# Patient Record
Sex: Female | Born: 1950 | Race: White | Hispanic: No | Marital: Single | Smoking: Never smoker
Health system: Southern US, Community
[De-identification: ages and names within clinical notes are randomized; demographics above are authoritative.]

## PROBLEM LIST (undated history)

## (undated) DIAGNOSIS — J189 Pneumonia, unspecified organism: Secondary | ICD-10-CM

## (undated) DIAGNOSIS — G4700 Insomnia, unspecified: Secondary | ICD-10-CM

## (undated) DIAGNOSIS — R002 Palpitations: Secondary | ICD-10-CM

## (undated) DIAGNOSIS — I10 Essential (primary) hypertension: Secondary | ICD-10-CM

## (undated) DIAGNOSIS — M1990 Unspecified osteoarthritis, unspecified site: Secondary | ICD-10-CM

## (undated) DIAGNOSIS — J45909 Unspecified asthma, uncomplicated: Secondary | ICD-10-CM

## (undated) HISTORY — PX: TONSILLECTOMY: SUR1361

## (undated) HISTORY — PX: BUNIONECTOMY: SHX129

## (undated) HISTORY — PX: KNEE ARTHROPLASTY: SHX992

---

## 2006-11-28 ENCOUNTER — Inpatient Hospital Stay (HOSPITAL_COMMUNITY): Admission: RE | Admit: 2006-11-28 | Discharge: 2006-11-30 | Payer: Self-pay | Admitting: Orthopedic Surgery

## 2008-08-02 IMAGING — CR DG CHEST 2V
2 series · 2 of 2 positions shown · non-contrast
Comparison: None.

Exam: Chest, 2 views.

HISTORY: Preoperative radiograph.

[view not recorded (1 of 2)]
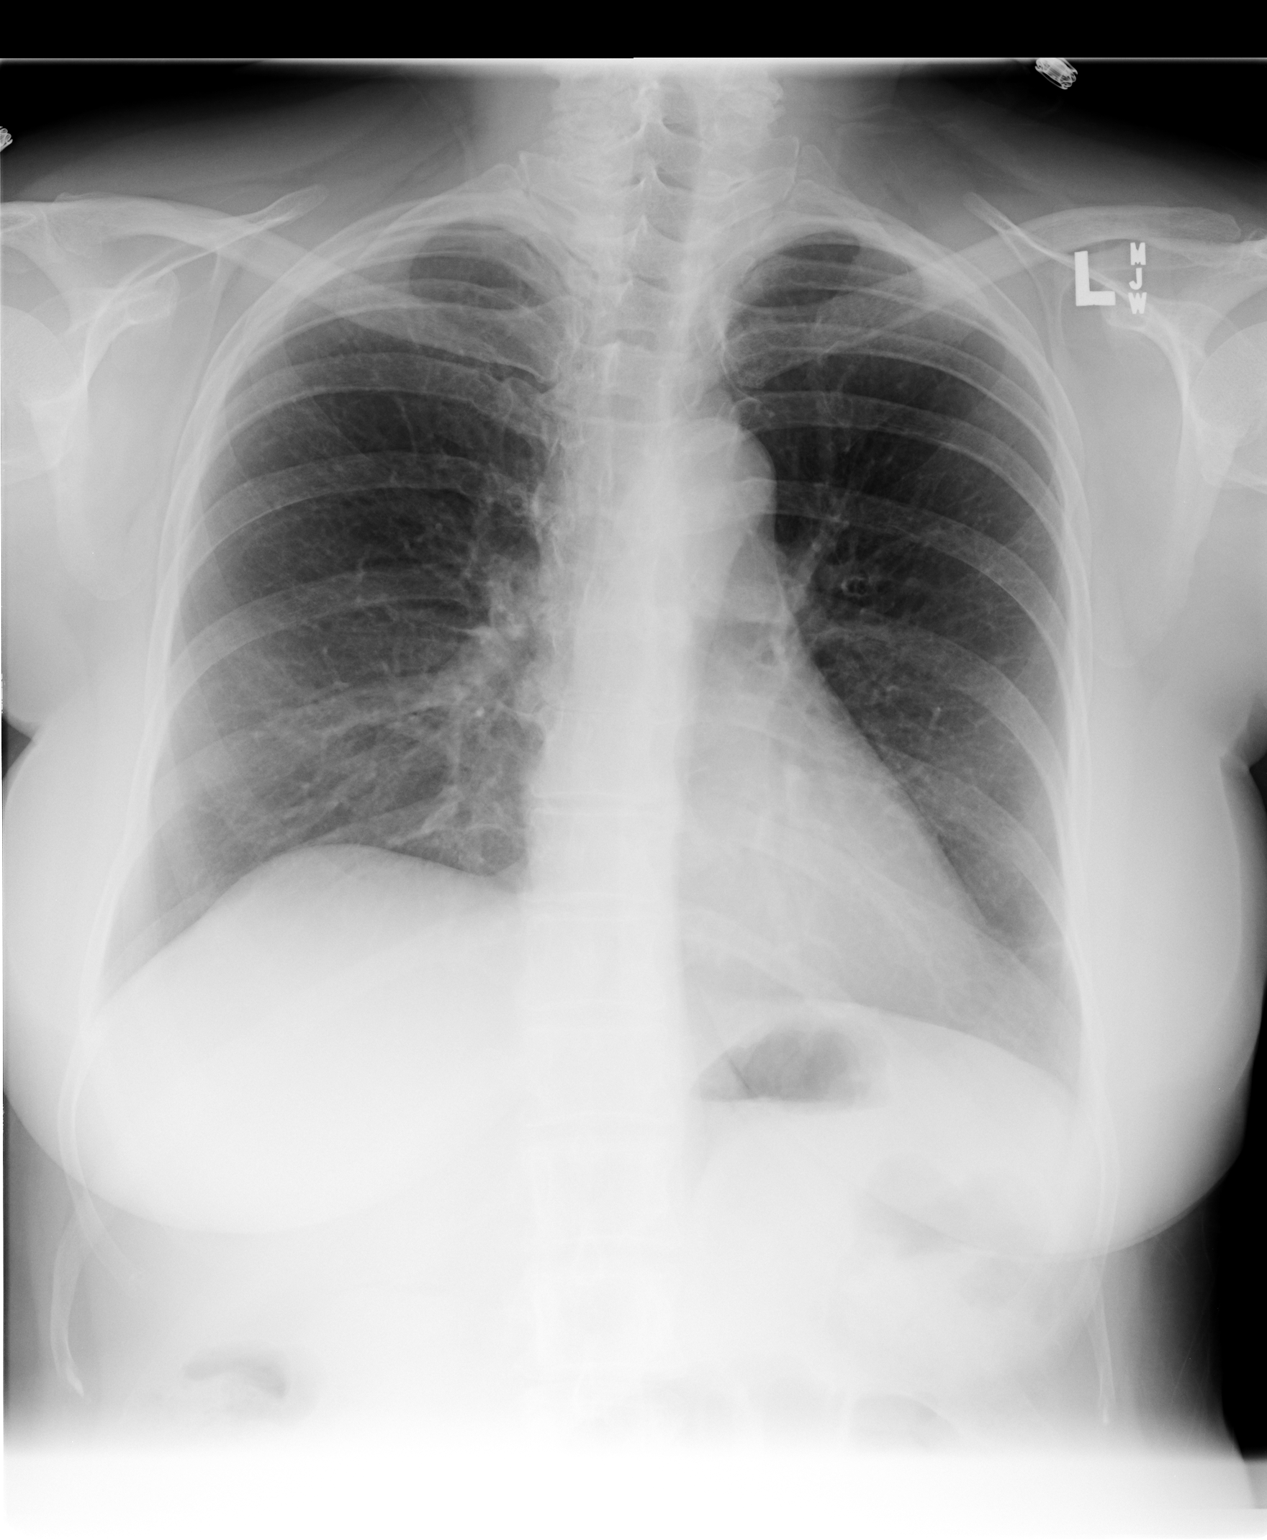

[view not recorded (2 of 2)]
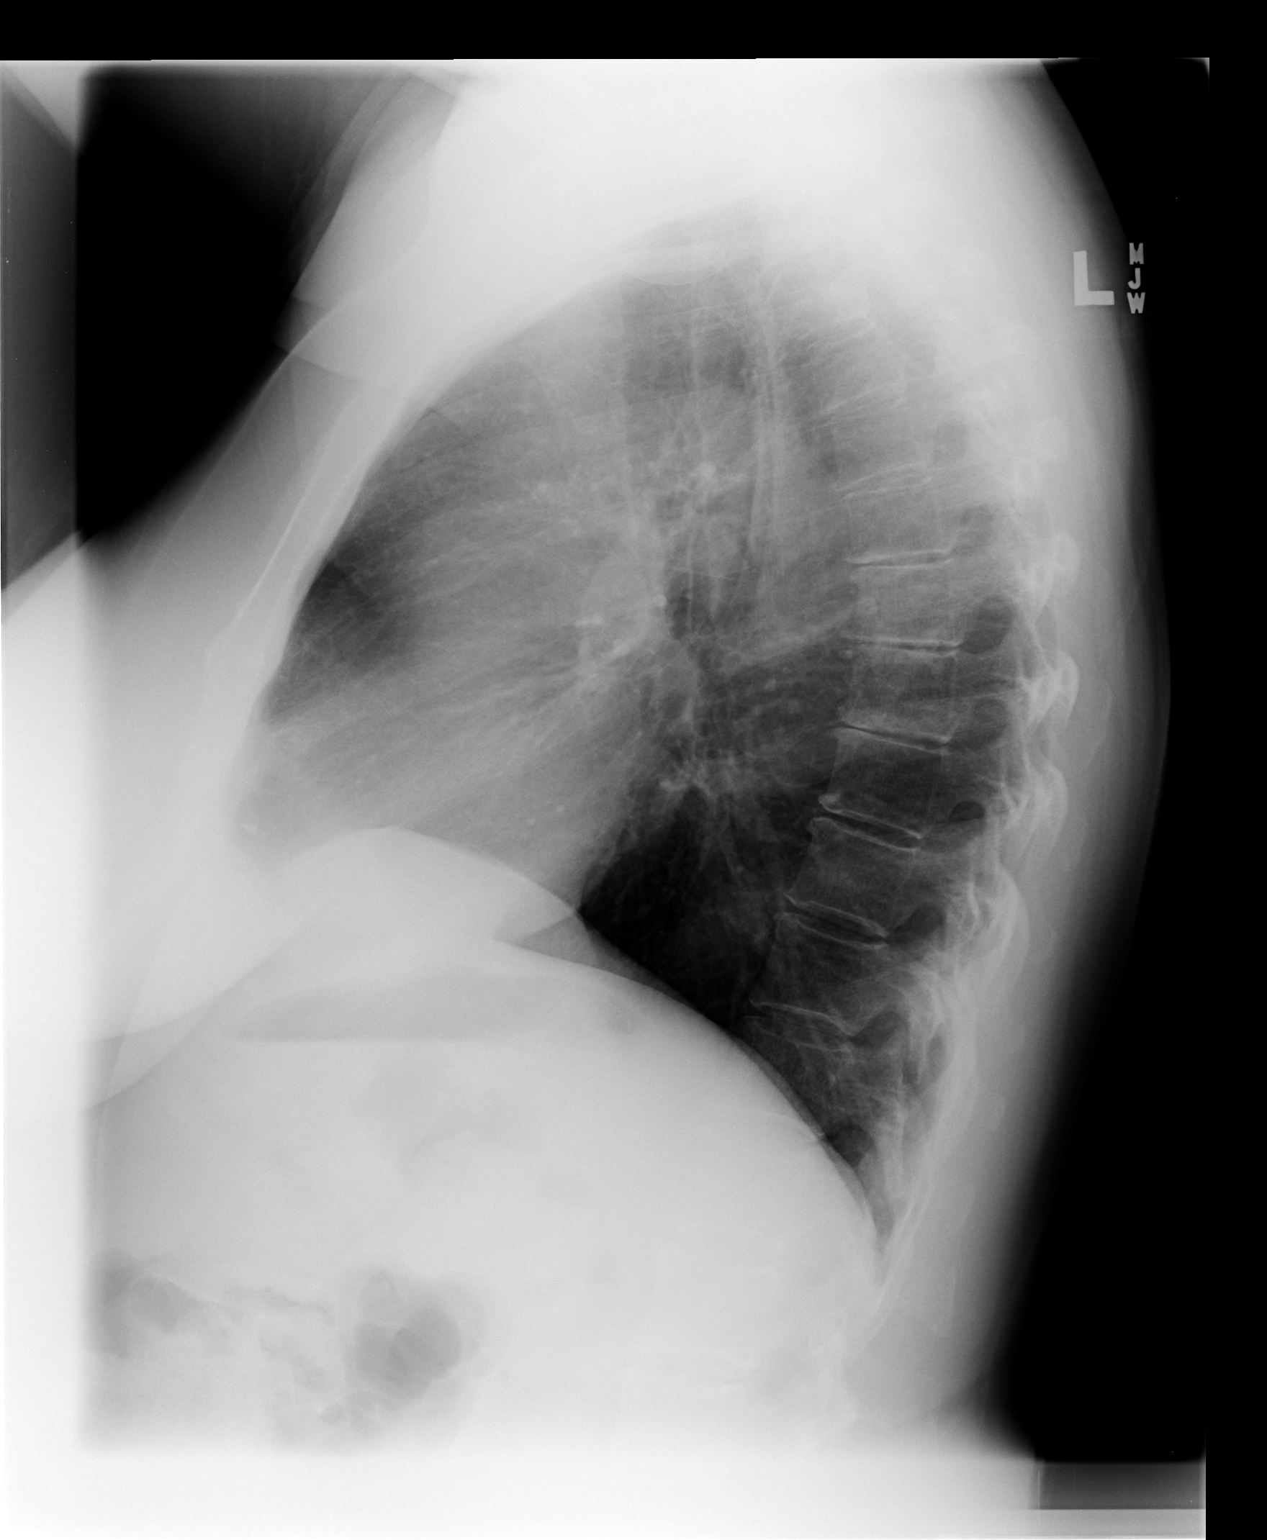

[2 of 2 positions shown; findings below may reference images not displayed]

FINDINGS: The heart size is normal.

There is no pleural effusions or pulmonary edema. 

Scar versus atelectasis identified right lung base. 

The remaining portions of the lungs are clear.
IMPRESSION: 1. Right base scar vs. atelectasis

## 2008-08-08 IMAGING — CR DG CHEST 1V
1 series · 1 of 1 positions shown · non-contrast
Comparison: 11/22/06.

CLINICAL DATA: Osteoarthritis left knee.  Preoperative respiratory exam. 
 CHEST ? 1 VIEW:

[view not recorded]
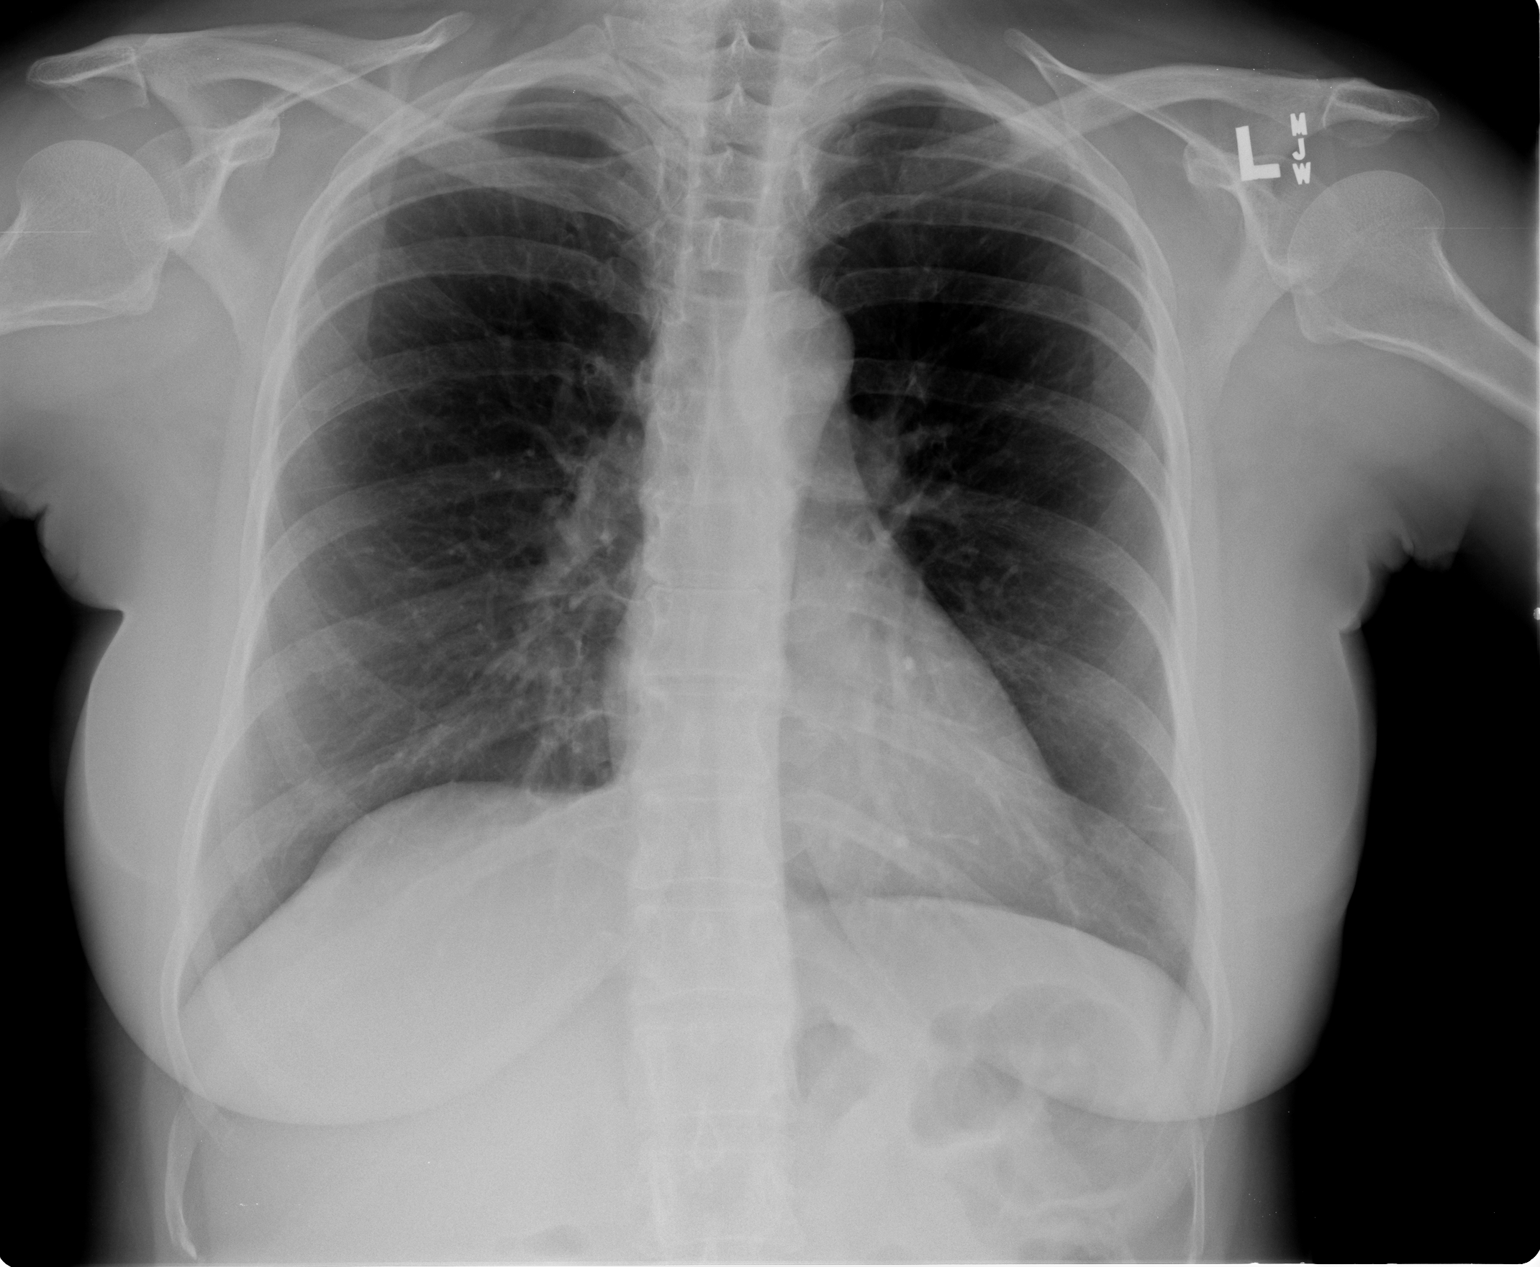

[1 of 1 positions shown; findings below may reference images not displayed]

FINDINGS: The heart and mediastinum are normal.  The lungs are clear except for a minimal scar at the left base laterally.  Vascularity normal.  No effusions.
IMPRESSION: No active disease.
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
## 2010-09-08 NOTE — Op Note (Signed)
NAME:  Osterman, Skylarr                  ACCOUNT NO.:  [id number]   MEDICAL RECORD NO.:  [id number]          PATIENT TYPE:  INP   LOCATION:  2550                         FACILITY:  [facility]   PHYSICIAN:  Mila Homer. Sherlean Foot, M.D. DATE OF BIRTH:  1951/04/13   DATE OF PROCEDURE:  11/28/2006  DATE OF DISCHARGE:                               OPERATIVE REPORT   SURGEON:  Mila Homer. Sherlean Foot, M.D.   ASSISTANTArlys John D. Petrarca, P.A.-C.   ANESTHESIA:  General.   PREOPERATIVE DIAGNOSIS:  Left knee osteoarthritis.   POSTOPERATIVE DIAGNOSIS:  Left knee osteoarthritis.   PROCEDURE:  Left total knee arthroplasty.   INDICATIONS FOR PROCEDURE:  The patient is a 60 year old white female  with failure of conservative measures for osteoarthritis of the left  knee.  Informed consent was obtained.   DESCRIPTION OF PROCEDURE:  The patient was laid supine and administered  general anesthesia.  Left leg prepped and draped in usual sterile  fashion.  The extremity was exsanguinated with the Esmarch, tourniquet  inflated to 350 mmHg.  I used a #10 blade to make a midline incision, a  new blade to make a medial parapatellar arthrotomy, performed  synovectomy.  The extramedullary alignment system was used on the tibia  to make a perpendicular cut to the anatomic axis.  Next intramedullary  alignment used on the femur to make a 6 degrees valgus cut since this  was a varus knee.  I then made the epicondylar axis, measured posterior  condylar angle, measured at 3 degrees, pinned through the 3 degree hole  sizing for E femur.  I then made drilled for lug holes __________  box.  I then finished the tibia with a size 4 tibial tray drill and keel.  I  removed the ACL, PCL, medial and lateral menisci, posterior condylar  osteophytes and balanced the knee with spacer block.  I then finished  the patella was well.  We had good patellar tracking.  I then removed  trial components, copiously irrigated.  I then cemented in  components  removing excess cement and the cement hardened in extension.  I placed a  Hemovac coming out superolateral and deep the arthrotomy, pain catheter  coming out superior medially and superficial to the arthrotomy.  Let the  tourniquet down, obtained hemostasis and irrigated again.  I then closed  the arthrotomy with figure-of-eight sutures #1 Vicryl sutures.  Put 0  Vicryls in deep soft tissues and subcuticular 2-0 Vicryl stitch and skin  staples.   COMPLICATIONS:  None.   ESTIMATED BLOOD LOSS:  300 mL.   DRAINS:  One Hemovac, one pain catheter.   DRESSING:  Xeroform dressing sponges, sterile Webril TED stocking.           ______________________________  Mila Homer. Sherlean Foot, M.D.     SDL/MEDQ  D:  11/28/2006  T:  11/28/2006  Job:  [id number]

## 2011-02-08 LAB — CROSSMATCH

## 2011-02-08 LAB — URINE CULTURE

## 2011-02-08 LAB — DIFFERENTIAL
Basophils Relative: 1
Eosinophils Absolute: 0.5
Eosinophils Relative: 9 — ABNORMAL HIGH
Lymphs Abs: 1.8
Monocytes Absolute: 0.4
Monocytes Relative: 7
Neutrophils Relative %: 53

## 2011-02-08 LAB — URINALYSIS, ROUTINE W REFLEX MICROSCOPIC
Bilirubin Urine: NEGATIVE
Glucose, UA: NEGATIVE
Hgb urine dipstick: NEGATIVE
Specific Gravity, Urine: 1.023
Urobilinogen, UA: 0.2
pH: 5

## 2011-02-08 LAB — BASIC METABOLIC PANEL
BUN: 9
CO2: 23
CO2: 27
Calcium: 8.5
Chloride: 106
GFR calc non Af Amer: >60
Glucose, Bld: 102 — ABNORMAL HIGH
Glucose, Bld: 114 — ABNORMAL HIGH
Potassium: 3.9
Sodium: 139

## 2011-02-08 LAB — CBC
HCT: 26.5 — ABNORMAL LOW
HCT: 32.3 — ABNORMAL LOW
Hemoglobin: 10.9 — ABNORMAL LOW
Hemoglobin: 13.4
MCHC: 33.8
MCHC: 34.6
Platelets: 221
RBC: 4.43
RDW: 11.8
RDW: 12.5
RDW: 12.4

## 2011-02-08 LAB — URINE MICROSCOPIC-ADD ON

## 2011-02-08 LAB — COMPREHENSIVE METABOLIC PANEL
ALT: 18
AST: 18
Alkaline Phosphatase: 73
Calcium: 9.4
GFR calc Af Amer: >60
Glucose, Bld: 98
Potassium: 4.3
Sodium: 139
Total Protein: 6.8

## 2011-02-08 LAB — ABO/RH: ABO/RH(D): B NEG

## 2014-07-12 ENCOUNTER — Other Ambulatory Visit: Payer: Self-pay | Admitting: Orthopedic Surgery

## 2014-07-18 ENCOUNTER — Ambulatory Visit (HOSPITAL_COMMUNITY)
Admission: RE | Admit: 2014-07-18 | Discharge: 2014-07-18 | Disposition: A | Discharge status: Home / Self Care | Payer: PRIVATE HEALTH INSURANCE | Source: Ambulatory Visit | Attending: Orthopedic Surgery | Admitting: Orthopedic Surgery

## 2014-07-18 ENCOUNTER — Encounter (HOSPITAL_COMMUNITY): Payer: Self-pay

## 2014-07-18 ENCOUNTER — Encounter (HOSPITAL_COMMUNITY)
Admission: RE | Admit: 2014-07-18 | Discharge: 2014-07-18 | Disposition: A | Discharge status: Home / Self Care | Payer: PRIVATE HEALTH INSURANCE | Source: Ambulatory Visit | Attending: Orthopedic Surgery | Admitting: Orthopedic Surgery

## 2014-07-18 DIAGNOSIS — Z01818 Encounter for other preprocedural examination: Secondary | ICD-10-CM

## 2014-07-18 HISTORY — DX: Unspecified osteoarthritis, unspecified site: M19.90

## 2014-07-18 HISTORY — DX: Palpitations: R00.2

## 2014-07-18 HISTORY — DX: Unspecified asthma, uncomplicated: J45.909

## 2014-07-18 HISTORY — DX: Pneumonia, unspecified organism: J18.9

## 2014-07-18 HISTORY — DX: Insomnia, unspecified: G47.00

## 2014-07-18 HISTORY — DX: Essential (primary) hypertension: I10

## 2014-07-18 LAB — PROTIME-INR
INR: 1.01 (ref 0.00–1.49)
Prothrombin Time: 13.4 seconds (ref 11.6–15.2)

## 2014-07-18 LAB — CBC WITH DIFFERENTIAL/PLATELET
Basophils Absolute: 0 10*3/uL (ref 0.0–0.1)
Basophils Relative: 1 % (ref 0–1)
Eosinophils Absolute: 0.4 10*3/uL (ref 0.0–0.7)
Eosinophils Relative: 7 % — ABNORMAL HIGH (ref 0–5)
HCT: 38.5 % (ref 36.0–46.0)
Hemoglobin: 12.8 g/dL (ref 12.0–15.0)
LYMPHS PCT: 29 % (ref 12–46)
Lymphs Abs: 1.8 10*3/uL (ref 0.7–4.0)
MCH: 29.3 pg (ref 26.0–34.0)
MCHC: 33.2 g/dL (ref 30.0–36.0)
MCV: 88.1 fL (ref 78.0–100.0)
MONO ABS: 0.5 10*3/uL (ref 0.1–1.0)
MONOS PCT: 9 % (ref 3–12)
Neutro Abs: 3.4 10*3/uL (ref 1.7–7.7)
Neutrophils Relative %: 54 % (ref 43–77)
PLATELETS: 284 10*3/uL (ref 150–400)
RBC: 4.37 MIL/uL (ref 3.87–5.11)
RDW: 12.7 % (ref 11.5–15.5)
WBC: 6.2 10*3/uL (ref 4.0–10.5)

## 2014-07-18 LAB — URINE MICROSCOPIC-ADD ON

## 2014-07-18 LAB — COMPREHENSIVE METABOLIC PANEL
ALT: 22 U/L (ref 0–35)
AST: 20 U/L (ref 0–37)
Albumin: 4 g/dL (ref 3.5–5.2)
Alkaline Phosphatase: 90 U/L (ref 39–117)
Anion gap: 9 (ref 5–15)
BILIRUBIN TOTAL: 0.5 mg/dL (ref 0.3–1.2)
BUN: 22 mg/dL (ref 6–23)
CHLORIDE: 104 mmol/L (ref 96–112)
CO2: 25 mmol/L (ref 19–32)
Calcium: 9.6 mg/dL (ref 8.4–10.5)
Creatinine, Ser: 0.64 mg/dL (ref 0.50–1.10)
GFR calc non Af Amer: >90 mL/min (ref >90)
GLUCOSE: 95 mg/dL (ref 70–99)
POTASSIUM: 3.6 mmol/L (ref 3.5–5.1)
Sodium: 138 mmol/L (ref 135–145)
TOTAL PROTEIN: 6.6 g/dL (ref 6.0–8.3)

## 2014-07-18 LAB — APTT: aPTT: 31 seconds (ref 24–37)

## 2014-07-18 LAB — URINALYSIS, ROUTINE W REFLEX MICROSCOPIC
BILIRUBIN URINE: NEGATIVE
Glucose, UA: NEGATIVE mg/dL
KETONES UR: 40 mg/dL — AB
Leukocytes, UA: NEGATIVE
NITRITE: NEGATIVE
PROTEIN: NEGATIVE mg/dL
Specific Gravity, Urine: 1.029 (ref 1.005–1.030)
UROBILINOGEN UA: 0.2 mg/dL (ref 0.0–1.0)
pH: 5.5 (ref 5.0–8.0)

## 2014-07-18 LAB — SURGICAL PCR SCREEN
MRSA, PCR: POSITIVE — AB
Staphylococcus aureus: POSITIVE — AB

## 2014-07-18 NOTE — Pre-Procedure Instructions (Signed)
Dawn HurlLinda J Sallade  07/18/2014   Your procedure is scheduled on:  Monday July 29, 2014 at 9:50 AM.  [facility] at 7:50 AM.  Call this number if you have problems the morning of surgery: [phone]   Remember:   Do not eat food or drink liquids after midnight.   Take these medicines the morning of surgery with A SIP OF WATER: Symbicort inhaler, Hydrocodone if needed, Tramadol (Ultram) if needed    Please stop taking any vitamins, Ginger, Ibuprofen, etc on Monday March 28th.    Do not wear jewelry, make-up or nail polish.  Do not wear lotions, powders, or perfumes.   Do not shave 48 hours prior to surgery.   Do not bring valuables to the hospital.  [facility] is not responsible for any belongings or valuables.               Contacts, dentures or bridgework may not be worn into surgery.  Leave suitcase in the car. After surgery it may be brought to your room.  For patients admitted to the hospital, discharge time is determined by your treatment team.               Patients discharged the day of surgery will not be allowed to drive home.  Name and phone number of your driver:   Special Instructions: Shower using CHG soap the night before and the morning of your surgery   Please read over the following fact sheets that you were given: Pain Booklet, Coughing and Deep Breathing, MRSA Information and Surgical Site Infection Prevention

## 2014-07-19 LAB — URINE CULTURE: Colony Count: 4000

## 2014-07-26 MED ORDER — TRANEXAMIC ACID 100 MG/ML IV SOLN
1000.0000 mg | INTRAVENOUS | Status: AC
  Administered 2014-07-29: 1000 mg via INTRAVENOUS
  Filled 2014-07-26: qty 10

## 2014-07-26 MED ORDER — TRANEXAMIC ACID 100 MG/ML IV SOLN
1000.0000 mg | INTRAVENOUS | Status: DC
  Filled 2014-07-26: qty 10

## 2014-07-26 MED ORDER — BUPIVACAINE LIPOSOME 1.3 % IJ SUSP
20.0000 mL | Freq: Once | INTRAMUSCULAR | Status: AC
  Administered 2014-07-29: 20 mL
  Filled 2014-07-26: qty 20

## 2014-07-26 MED ORDER — BUPIVACAINE LIPOSOME 1.3 % IJ SUSP
20.0000 mL | Freq: Once | INTRAMUSCULAR | Status: DC
  Filled 2014-07-26: qty 20

## 2014-07-28 MED ORDER — CEFAZOLIN SODIUM-DEXTROSE 2-3 GM-% IV SOLR
2.0000 g | INTRAVENOUS | Status: AC
  Administered 2014-07-29: 2 g via INTRAVENOUS
  Filled 2014-07-28: qty 50

## 2014-07-29 ENCOUNTER — Inpatient Hospital Stay (HOSPITAL_COMMUNITY): Payer: PRIVATE HEALTH INSURANCE | Admitting: Anesthesiology

## 2014-07-29 ENCOUNTER — Encounter (HOSPITAL_COMMUNITY)
Admission: RE | Disposition: A | Discharge status: Home Health | Payer: Self-pay | Source: Ambulatory Visit | Attending: Orthopedic Surgery

## 2014-07-29 ENCOUNTER — Encounter (HOSPITAL_COMMUNITY): Payer: Self-pay | Admitting: *Deleted

## 2014-07-29 ENCOUNTER — Inpatient Hospital Stay (HOSPITAL_COMMUNITY)
Admission: RE | Admit: 2014-07-29 | Discharge: 2014-07-31 | DRG: 470 | Disposition: A | Discharge status: Home Health | Payer: PRIVATE HEALTH INSURANCE | Source: Ambulatory Visit | Attending: Orthopedic Surgery | Admitting: Orthopedic Surgery

## 2014-07-29 DIAGNOSIS — T84033A Mechanical loosening of internal left knee prosthetic joint, initial encounter: Secondary | ICD-10-CM | POA: Diagnosis present

## 2014-07-29 DIAGNOSIS — Z9181 History of falling: Secondary | ICD-10-CM

## 2014-07-29 DIAGNOSIS — J45909 Unspecified asthma, uncomplicated: Secondary | ICD-10-CM | POA: Diagnosis present

## 2014-07-29 DIAGNOSIS — M1990 Unspecified osteoarthritis, unspecified site: Secondary | ICD-10-CM | POA: Diagnosis present

## 2014-07-29 DIAGNOSIS — D62 Acute posthemorrhagic anemia: Secondary | ICD-10-CM | POA: Diagnosis not present

## 2014-07-29 DIAGNOSIS — Z96659 Presence of unspecified artificial knee joint: Secondary | ICD-10-CM

## 2014-07-29 DIAGNOSIS — I10 Essential (primary) hypertension: Secondary | ICD-10-CM | POA: Diagnosis present

## 2014-07-29 DIAGNOSIS — M25562 Pain in left knee: Secondary | ICD-10-CM | POA: Diagnosis present

## 2014-07-29 HISTORY — PX: TOTAL KNEE REVISION: SHX996

## 2014-07-29 LAB — CBC
HCT: 32.5 % — ABNORMAL LOW (ref 36.0–46.0)
HEMOGLOBIN: 10.8 g/dL — AB (ref 12.0–15.0)
MCH: 29 pg (ref 26.0–34.0)
MCHC: 33.2 g/dL (ref 30.0–36.0)
MCV: 87.4 fL (ref 78.0–100.0)
Platelets: 290 10*3/uL (ref 150–400)
RBC: 3.72 MIL/uL — ABNORMAL LOW (ref 3.87–5.11)
RDW: 12.8 % (ref 11.5–15.5)
WBC: 11.4 10*3/uL — ABNORMAL HIGH (ref 4.0–10.5)

## 2014-07-29 LAB — CREATININE, SERUM
CREATININE: 0.62 mg/dL (ref 0.50–1.10)
GFR calc non Af Amer: >90 mL/min (ref >90)

## 2014-07-29 SURGERY — TOTAL KNEE REVISION
Anesthesia: Spinal | Site: Knee | Laterality: Left

## 2014-07-29 MED ORDER — ONDANSETRON HCL 4 MG/2ML IJ SOLN
4.0000 mg | Freq: Four times a day (QID) | INTRAMUSCULAR | Status: DC | PRN
  Administered 2014-07-29 (×2): 4 mg via INTRAVENOUS
  Filled 2014-07-29 (×2): qty 2

## 2014-07-29 MED ORDER — CHLORHEXIDINE GLUCONATE 4 % EX LIQD: 60.0000 mL | Freq: Once | CUTANEOUS | Status: DC

## 2014-07-29 MED ORDER — SENNOSIDES-DOCUSATE SODIUM 8.6-50 MG PO TABS: 1.0000 | ORAL_TABLET | Freq: Every evening | ORAL | Status: DC | PRN

## 2014-07-29 MED ORDER — SODIUM CHLORIDE 0.9 % IR SOLN
Status: DC | PRN
  Administered 2014-07-29 (×2): 1000 mL

## 2014-07-29 MED ORDER — FENTANYL CITRATE 0.05 MG/ML IJ SOLN
INTRAMUSCULAR | Status: AC
  Filled 2014-07-29: qty 2

## 2014-07-29 MED ORDER — GLYCOPYRROLATE 0.2 MG/ML IJ SOLN
INTRAMUSCULAR | Status: DC | PRN
  Administered 2014-07-29 (×2): .3 mg via INTRAVENOUS

## 2014-07-29 MED ORDER — BUPIVACAINE-EPINEPHRINE (PF) 0.5% -1:200000 IJ SOLN
INTRAMUSCULAR | Status: DC | PRN
  Administered 2014-07-29: 30 mL

## 2014-07-29 MED ORDER — OXYCODONE HCL 5 MG PO TABS
ORAL_TABLET | ORAL | Status: AC
  Filled 2014-07-29: qty 1

## 2014-07-29 MED ORDER — GLYCOPYRROLATE 0.2 MG/ML IJ SOLN
INTRAMUSCULAR | Status: AC
  Filled 2014-07-29: qty 3

## 2014-07-29 MED ORDER — FENTANYL CITRATE 0.05 MG/ML IJ SOLN
INTRAMUSCULAR | Status: DC | PRN
  Administered 2014-07-29: 50 ug via INTRAVENOUS
  Administered 2014-07-29: 100 ug via INTRAVENOUS
  Administered 2014-07-29 (×2): 25 ug via INTRAVENOUS

## 2014-07-29 MED ORDER — ARTIFICIAL TEARS OP OINT
TOPICAL_OINTMENT | OPHTHALMIC | Status: AC
  Filled 2014-07-29: qty 3.5

## 2014-07-29 MED ORDER — ZOLPIDEM TARTRATE 5 MG PO TABS
5.0000 mg | ORAL_TABLET | Freq: Every evening | ORAL | Status: DC | PRN
  Administered 2014-07-30: 5 mg via ORAL
  Filled 2014-07-29: qty 1

## 2014-07-29 MED ORDER — METOCLOPRAMIDE HCL 5 MG/ML IJ SOLN: 5.0000 mg | Freq: Three times a day (TID) | INTRAMUSCULAR | Status: DC | PRN

## 2014-07-29 MED ORDER — BUDESONIDE-FORMOTEROL FUMARATE 160-4.5 MCG/ACT IN AERO
2.0000 | INHALATION_SPRAY | Freq: Every day | RESPIRATORY_TRACT | Status: DC
  Filled 2014-07-29: qty 6

## 2014-07-29 MED ORDER — ONDANSETRON HCL 4 MG/2ML IJ SOLN
INTRAMUSCULAR | Status: AC
  Filled 2014-07-29: qty 2

## 2014-07-29 MED ORDER — 0.9 % SODIUM CHLORIDE (POUR BTL) OPTIME
TOPICAL | Status: DC | PRN
  Administered 2014-07-29: 1000 mL

## 2014-07-29 MED ORDER — LIDOCAINE HCL (CARDIAC) 20 MG/ML IV SOLN
INTRAVENOUS | Status: AC
  Filled 2014-07-29: qty 5

## 2014-07-29 MED ORDER — FENTANYL CITRATE 0.05 MG/ML IJ SOLN
INTRAMUSCULAR | Status: AC
  Administered 2014-07-29: 100 ug via INTRAVENOUS
  Filled 2014-07-29: qty 2

## 2014-07-29 MED ORDER — FLUTICASONE PROPIONATE 50 MCG/ACT NA SUSP: 1.0000 | Freq: Every day | NASAL | Status: DC

## 2014-07-29 MED ORDER — ONDANSETRON HCL 4 MG/2ML IJ SOLN: 4.0000 mg | Freq: Four times a day (QID) | INTRAMUSCULAR | Status: DC | PRN

## 2014-07-29 MED ORDER — METHOCARBAMOL 1000 MG/10ML IJ SOLN
500.0000 mg | Freq: Four times a day (QID) | INTRAMUSCULAR | Status: DC | PRN
  Filled 2014-07-29 (×3): qty 5

## 2014-07-29 MED ORDER — ROCURONIUM BROMIDE 100 MG/10ML IV SOLN
INTRAVENOUS | Status: DC | PRN
  Administered 2014-07-29: 40 mg via INTRAVENOUS

## 2014-07-29 MED ORDER — FENTANYL CITRATE 0.05 MG/ML IJ SOLN
INTRAMUSCULAR | Status: AC
  Administered 2014-07-29: 50 ug via INTRAVENOUS
  Filled 2014-07-29: qty 2

## 2014-07-29 MED ORDER — MIDAZOLAM HCL 2 MG/2ML IJ SOLN
INTRAMUSCULAR | Status: AC
  Filled 2014-07-29: qty 2

## 2014-07-29 MED ORDER — SODIUM CHLORIDE 0.9 % IV SOLN: INTRAVENOUS | Status: DC

## 2014-07-29 MED ORDER — FLEET ENEMA 7-19 GM/118ML RE ENEM: 1.0000 | ENEMA | Freq: Once | RECTAL | Status: AC | PRN

## 2014-07-29 MED ORDER — SODIUM CHLORIDE 0.9 % IV SOLN: 1000.0000 mg | Freq: Once | INTRAVENOUS | Status: DC

## 2014-07-29 MED ORDER — HYDROMORPHONE HCL 1 MG/ML IJ SOLN
1.0000 mg | INTRAMUSCULAR | Status: DC | PRN
  Administered 2014-07-29 – 2014-07-30 (×10): 1 mg via INTRAVENOUS
  Filled 2014-07-29 (×10): qty 1

## 2014-07-29 MED ORDER — OXYCODONE HCL 5 MG PO TABS
5.0000 mg | ORAL_TABLET | ORAL | Status: DC | PRN
  Administered 2014-07-29 – 2014-07-31 (×8): 10 mg via ORAL
  Filled 2014-07-29 (×8): qty 2

## 2014-07-29 MED ORDER — ONDANSETRON HCL 4 MG PO TABS
4.0000 mg | ORAL_TABLET | Freq: Four times a day (QID) | ORAL | Status: DC | PRN
  Administered 2014-07-30: 4 mg via ORAL
  Filled 2014-07-29: qty 1

## 2014-07-29 MED ORDER — MENTHOL 3 MG MT LOZG: 1.0000 | LOZENGE | OROMUCOSAL | Status: DC | PRN

## 2014-07-29 MED ORDER — CEFAZOLIN SODIUM 1-5 GM-% IV SOLN
1.0000 g | Freq: Four times a day (QID) | INTRAVENOUS | Status: AC
  Administered 2014-07-29 (×2): 1 g via INTRAVENOUS
  Filled 2014-07-29 (×3): qty 50

## 2014-07-29 MED ORDER — CELECOXIB 200 MG PO CAPS: 200.0000 mg | ORAL_CAPSULE | Freq: Two times a day (BID) | ORAL | Status: DC

## 2014-07-29 MED ORDER — CELECOXIB 200 MG PO CAPS
200.0000 mg | ORAL_CAPSULE | Freq: Two times a day (BID) | ORAL | Status: DC
  Filled 2014-07-29 (×3): qty 1

## 2014-07-29 MED ORDER — NEOSTIGMINE METHYLSULFATE 10 MG/10ML IV SOLN
INTRAVENOUS | Status: DC | PRN
  Administered 2014-07-29 (×2): 2 mg via INTRAVENOUS

## 2014-07-29 MED ORDER — ALUM & MAG HYDROXIDE-SIMETH 200-200-20 MG/5ML PO SUSP: 30.0000 mL | ORAL | Status: DC | PRN

## 2014-07-29 MED ORDER — PHENYLEPHRINE HCL 10 MG/ML IJ SOLN
10.0000 mg | INTRAMUSCULAR | Status: DC | PRN
  Administered 2014-07-29: 40 ug/min via INTRAVENOUS

## 2014-07-29 MED ORDER — PHENOL 1.4 % MT LIQD: 1.0000 | OROMUCOSAL | Status: DC | PRN

## 2014-07-29 MED ORDER — DEXTROSE 5 % IV SOLN
INTRAVENOUS | Status: DC | PRN
  Administered 2014-07-29: 10:00:00 via INTRAVENOUS

## 2014-07-29 MED ORDER — ACETAMINOPHEN 650 MG RE SUPP: 650.0000 mg | Freq: Four times a day (QID) | RECTAL | Status: DC | PRN

## 2014-07-29 MED ORDER — FENTANYL CITRATE 0.05 MG/ML IJ SOLN
INTRAMUSCULAR | Status: AC
  Filled 2014-07-29: qty 5

## 2014-07-29 MED ORDER — PROPOFOL 10 MG/ML IV BOLUS
INTRAVENOUS | Status: DC | PRN
  Administered 2014-07-29: 160 mg via INTRAVENOUS

## 2014-07-29 MED ORDER — ONDANSETRON HCL 4 MG/2ML IJ SOLN
INTRAMUSCULAR | Status: DC | PRN
  Administered 2014-07-29: 4 mg via INTRAVENOUS

## 2014-07-29 MED ORDER — BISACODYL 5 MG PO TBEC: 5.0000 mg | DELAYED_RELEASE_TABLET | Freq: Every day | ORAL | Status: DC | PRN

## 2014-07-29 MED ORDER — MIDAZOLAM HCL 2 MG/2ML IJ SOLN
INTRAMUSCULAR | Status: AC
  Administered 2014-07-29: 2 mg via INTRAVENOUS
  Filled 2014-07-29: qty 2

## 2014-07-29 MED ORDER — METOCLOPRAMIDE HCL 5 MG PO TABS: 5.0000 mg | ORAL_TABLET | Freq: Three times a day (TID) | ORAL | Status: DC | PRN

## 2014-07-29 MED ORDER — DOCUSATE SODIUM 100 MG PO CAPS
100.0000 mg | ORAL_CAPSULE | Freq: Two times a day (BID) | ORAL | Status: DC
  Administered 2014-07-29 – 2014-07-31 (×4): 100 mg via ORAL
  Filled 2014-07-29 (×4): qty 1

## 2014-07-29 MED ORDER — OXYCODONE HCL 5 MG/5ML PO SOLN: 5.0000 mg | Freq: Once | ORAL | Status: AC | PRN

## 2014-07-29 MED ORDER — METHOCARBAMOL 500 MG PO TABS
500.0000 mg | ORAL_TABLET | Freq: Four times a day (QID) | ORAL | Status: DC | PRN
  Administered 2014-07-29 – 2014-07-31 (×4): 500 mg via ORAL
  Filled 2014-07-29 (×5): qty 1

## 2014-07-29 MED ORDER — ENOXAPARIN SODIUM 30 MG/0.3ML ~~LOC~~ SOLN
30.0000 mg | Freq: Two times a day (BID) | SUBCUTANEOUS | Status: DC
Start: 2014-07-30 — End: 2014-07-31
  Administered 2014-07-30 – 2014-07-31 (×3): 30 mg via SUBCUTANEOUS
  Filled 2014-07-29 (×3): qty 0.3

## 2014-07-29 MED ORDER — BUPIVACAINE-EPINEPHRINE (PF) 0.5% -1:200000 IJ SOLN
INTRAMUSCULAR | Status: DC | PRN
  Administered 2014-07-29: 30 mL via PERINEURAL

## 2014-07-29 MED ORDER — MUPIROCIN 2 % EX OINT
1.0000 "application " | TOPICAL_OINTMENT | Freq: Two times a day (BID) | CUTANEOUS | Status: DC
  Administered 2014-07-29 – 2014-07-31 (×4): 1 via NASAL
  Filled 2014-07-29 (×2): qty 22

## 2014-07-29 MED ORDER — LOSARTAN POTASSIUM 50 MG PO TABS
50.0000 mg | ORAL_TABLET | Freq: Two times a day (BID) | ORAL | Status: DC
  Administered 2014-07-29 – 2014-07-31 (×4): 50 mg via ORAL
  Filled 2014-07-29 (×4): qty 1

## 2014-07-29 MED ORDER — DIPHENHYDRAMINE HCL 12.5 MG/5ML PO ELIX: 12.5000 mg | ORAL_SOLUTION | ORAL | Status: DC | PRN

## 2014-07-29 MED ORDER — CHLORHEXIDINE GLUCONATE CLOTH 2 % EX PADS
6.0000 | MEDICATED_PAD | Freq: Every day | CUTANEOUS | Status: DC
  Administered 2014-07-30 – 2014-07-31 (×2): 6 via TOPICAL

## 2014-07-29 MED ORDER — ARTIFICIAL TEARS OP OINT
TOPICAL_OINTMENT | OPHTHALMIC | Status: DC | PRN
  Administered 2014-07-29: 1 via OPHTHALMIC

## 2014-07-29 MED ORDER — ZOLPIDEM TARTRATE 5 MG PO TABS: 5.0000 mg | ORAL_TABLET | Freq: Every evening | ORAL | Status: DC | PRN

## 2014-07-29 MED ORDER — OXYCODONE HCL ER 10 MG PO T12A
10.0000 mg | EXTENDED_RELEASE_TABLET | Freq: Two times a day (BID) | ORAL | Status: DC
  Administered 2014-07-29 – 2014-07-30 (×2): 10 mg via ORAL
  Filled 2014-07-29 (×2): qty 1

## 2014-07-29 MED ORDER — FENTANYL CITRATE 0.05 MG/ML IJ SOLN
25.0000 ug | INTRAMUSCULAR | Status: DC | PRN
  Administered 2014-07-29 (×3): 50 ug via INTRAVENOUS

## 2014-07-29 MED ORDER — LIDOCAINE HCL (CARDIAC) 20 MG/ML IV SOLN
INTRAVENOUS | Status: DC | PRN
  Administered 2014-07-29: 70 mg via INTRAVENOUS

## 2014-07-29 MED ORDER — ROCURONIUM BROMIDE 50 MG/5ML IV SOLN
INTRAVENOUS | Status: AC
  Filled 2014-07-29: qty 1

## 2014-07-29 MED ORDER — LACTATED RINGERS IV SOLN
INTRAVENOUS | Status: DC | PRN
  Administered 2014-07-29 (×2): via INTRAVENOUS

## 2014-07-29 MED ORDER — LACTATED RINGERS IV SOLN
INTRAVENOUS | Status: DC
  Administered 2014-07-29: 08:00:00 via INTRAVENOUS

## 2014-07-29 MED ORDER — OXYCODONE HCL 5 MG PO TABS
5.0000 mg | ORAL_TABLET | Freq: Once | ORAL | Status: AC | PRN
  Administered 2014-07-29: 5 mg via ORAL

## 2014-07-29 MED ORDER — NEOSTIGMINE METHYLSULFATE 10 MG/10ML IV SOLN
INTRAVENOUS | Status: AC
  Filled 2014-07-29: qty 1

## 2014-07-29 MED ORDER — ACETAMINOPHEN 325 MG PO TABS
650.0000 mg | ORAL_TABLET | Freq: Four times a day (QID) | ORAL | Status: DC | PRN
  Filled 2014-07-29: qty 2

## 2014-07-29 SURGICAL SUPPLY — 86 items
BANDAGE ELASTIC 6 VELCRO ST LF (GAUZE/BANDAGES/DRESSINGS) ×3 IMPLANT
BANDAGE ESMARK 6X9 LF (GAUZE/BANDAGES/DRESSINGS) ×1 IMPLANT
BLADE 10 SAFETY STRL DISP (BLADE) ×3 IMPLANT
BLADE SAGITTAL 13X1.27X60 (BLADE) ×2 IMPLANT
BLADE SAGITTAL 13X1.27X60MM (BLADE) ×1
BLADE SAW SGTL 13X75X1.27 (BLADE) IMPLANT
BLADE SAW SGTL 83.5X18.5 (BLADE) ×3 IMPLANT
BLADE SAW SGTL NAR THIN XSHT (BLADE) ×3 IMPLANT
BLADE SURG 10 STRL SS (BLADE) ×15 IMPLANT
BNDG ESMARK 6X9 LF (GAUZE/BANDAGES/DRESSINGS) ×3
BONE CANC CHIPS 20CC PCAN1/4 (Bone Implant) ×3 IMPLANT
BONE CEMENT PALACOS R-G (Orthopedic Implant) ×6 IMPLANT
BOWL SMART MIX CTS (DISPOSABLE) IMPLANT
CEMENT BONE PALACOS R-G (Orthopedic Implant) ×2 IMPLANT
CHIPS CANC BONE 20CC PCAN1/4 (Bone Implant) ×1 IMPLANT
CMPNT FEM E 68X61.5STSCR (Orthopedic Implant) ×1 IMPLANT
COMPONENT FEM E 68X61.5STSCR (Orthopedic Implant) ×1 IMPLANT
COVER SURGICAL LIGHT HANDLE (MISCELLANEOUS) ×3 IMPLANT
CUFF TOURNIQUET SINGLE 34IN LL (TOURNIQUET CUFF) ×3 IMPLANT
DISTAL AUG ZIMMER (Orthopedic Implant) ×6 IMPLANT
DRAPE EXTREMITY T 121X128X90 (DRAPE) ×3 IMPLANT
DRAPE IMP U-DRAPE 54X76 (DRAPES) ×3 IMPLANT
DRAPE INCISE IOBAN 66X45 STRL (DRAPES) ×6 IMPLANT
DRAPE PROXIMA HALF (DRAPES) ×3 IMPLANT
DRAPE U-SHAPE 47X51 STRL (DRAPES) IMPLANT
DRSG ADAPTIC 3X8 NADH LF (GAUZE/BANDAGES/DRESSINGS) ×3 IMPLANT
DRSG PAD ABDOMINAL 8X10 ST (GAUZE/BANDAGES/DRESSINGS) IMPLANT
DURAPREP 26ML APPLICATOR (WOUND CARE) ×3 IMPLANT
ELECT REM PT RETURN 9FT ADLT (ELECTROSURGICAL)
ELECTRODE REM PT RTRN 9FT ADLT (ELECTROSURGICAL) IMPLANT
EVACUATOR 1/8 PVC DRAIN (DRAIN) ×3 IMPLANT
FACESHIELD WRAPAROUND (MASK) IMPLANT
FEMORAL COMP ZIMMER (Orthopedic Implant) ×2 IMPLANT
GAUZE SPONGE 4X4 12PLY STRL (GAUZE/BANDAGES/DRESSINGS) IMPLANT
GLOVE BIOGEL M 7.0 STRL (GLOVE) IMPLANT
GLOVE BIOGEL PI IND STRL 7.5 (GLOVE) ×1 IMPLANT
GLOVE BIOGEL PI IND STRL 8.5 (GLOVE) ×1 IMPLANT
GLOVE BIOGEL PI INDICATOR 7.5 (GLOVE) ×2
GLOVE BIOGEL PI INDICATOR 8.5 (GLOVE) ×2
GLOVE BIOGEL PI ORTHO PRO SZ8 (GLOVE) ×2
GLOVE PI ORTHO PRO STRL SZ8 (GLOVE) ×1 IMPLANT
GLOVE SURG ORTHO 8.0 STRL STRW (GLOVE) ×6 IMPLANT
GOWN STRL REUS W/ TWL LRG LVL3 (GOWN DISPOSABLE) ×1 IMPLANT
GOWN STRL REUS W/ TWL XL LVL3 (GOWN DISPOSABLE) ×2 IMPLANT
GOWN STRL REUS W/TWL LRG LVL3 (GOWN DISPOSABLE) ×2
GOWN STRL REUS W/TWL XL LVL3 (GOWN DISPOSABLE) ×4
HANDPIECE INTERPULSE COAX TIP (DISPOSABLE)
HOOD PEEL AWAY FACE SHEILD DIS (HOOD) ×9 IMPLANT
KIT BASIN OR (CUSTOM PROCEDURE TRAY) ×3 IMPLANT
KIT ROOM TURNOVER OR (KITS) ×3 IMPLANT
LPS ARTI SURF EF 3-4 23MM (Orthopedic Implant) ×3 IMPLANT
MANIFOLD NEPTUNE II (INSTRUMENTS) ×3 IMPLANT
NEEDLE 18GX1X1/2 (RX/OR ONLY) (NEEDLE) IMPLANT
NEEDLE 22X1 1/2 (OR ONLY) (NEEDLE) ×6 IMPLANT
NS IRRIG 1000ML POUR BTL (IV SOLUTION) ×3 IMPLANT
PACK TOTAL JOINT (CUSTOM PROCEDURE TRAY) ×3 IMPLANT
PACK UNIVERSAL I (CUSTOM PROCEDURE TRAY) ×3 IMPLANT
PAD ABD 8X10 STRL (GAUZE/BANDAGES/DRESSINGS) ×3 IMPLANT
PAD ARMBOARD 7.5X6 YLW CONV (MISCELLANEOUS) ×6 IMPLANT
PADDING CAST COTTON 6X4 STRL (CAST SUPPLIES) ×3 IMPLANT
PLATE TIB 4 66X46NT ZIER (Knees) ×1 IMPLANT
SET HNDPC FAN SPRY TIP SCT (DISPOSABLE) IMPLANT
SPONGE GAUZE 4X4 12PLY STER LF (GAUZE/BANDAGES/DRESSINGS) ×3 IMPLANT
SPONGE LAP 18X18 X RAY DECT (DISPOSABLE) ×3 IMPLANT
STAPLER VISISTAT 35W (STAPLE) ×3 IMPLANT
STEM ST EXT ZIER 100X145X15X (Stem) ×1 IMPLANT
STEM ST EXT ZIMMER (Stem) ×5 IMPLANT
SUCTION FRAZIER TIP 10 FR DISP (SUCTIONS) ×3 IMPLANT
SUT BONE WAX W31G (SUTURE) ×3 IMPLANT
SUT PDS AB 0 CT 36 (SUTURE) IMPLANT
SUT PDS AB 1 CT  36 (SUTURE)
SUT PDS AB 1 CT 36 (SUTURE) IMPLANT
SUT PDS AB 2-0 CT1 27 (SUTURE) IMPLANT
SUT VIC AB 0 CTB1 27 (SUTURE) ×6 IMPLANT
SUT VIC AB 1 CT1 27 (SUTURE) ×6
SUT VIC AB 1 CT1 27XBRD ANBCTR (SUTURE) ×3 IMPLANT
SUT VIC AB 2-0 CTB1 (SUTURE) ×6 IMPLANT
SWAB COLLECTION DEVICE MRSA (MISCELLANEOUS) ×3 IMPLANT
SYR 20CC LL (SYRINGE) ×6 IMPLANT
SYR CONTROL 10ML LL (SYRINGE) ×6 IMPLANT
TIBIA ZIMMER (Knees) ×2 IMPLANT
TOWEL OR 17X24 6PK STRL BLUE (TOWEL DISPOSABLE) ×3 IMPLANT
TOWEL OR 17X26 10 PK STRL BLUE (TOWEL DISPOSABLE) ×3 IMPLANT
TRAY FOLEY CATH 16FRSI W/METER (SET/KITS/TRAYS/PACK) ×3 IMPLANT
TUBE ANAEROBIC SPECIMEN COL (MISCELLANEOUS) IMPLANT
WATER STERILE IRR 1000ML POUR (IV SOLUTION) ×3 IMPLANT

## 2014-07-29 NOTE — Anesthesia Procedure Notes (Addendum)
Anesthesia Regional Block:  Adductor canal block  Pre-Anesthetic Checklist: ,, timeout performed, Correct Patient, Correct Site, Correct Laterality, Correct Procedure, Correct Position, site marked, Risks and benefits discussed,  Surgical consent,  Pre-op evaluation,  At surgeon's request and post-op pain management  Laterality: Left  Prep: chloraprep       Needles:  Injection technique: Single-shot  Needle Type: Echogenic Needle     Needle Length: 9cm 9 cm Needle Gauge: 21 and 21 G    Additional Needles:  Procedures: Doppler guided and ultrasound guided (picture in chart) Adductor canal block Narrative:  Start time: 07/29/2014 9:56 AM End time: 07/29/2014 10:08 AM  Performed by: Personally  Anesthesiologist: HODIERNE, ADAM  Additional Notes: Pt tolerated the procedure well.   Procedure Name: Intubation Date/Time: 07/29/2014 10:23 AM Performed by: Dawn Holmes, Dawn Furney B Pre-anesthesia Checklist: Patient identified, Emergency Drugs available and Suction available Patient Re-evaluated:Patient Re-evaluated prior to inductionOxygen Delivery Method: Circle system utilized Preoxygenation: Pre-oxygenation with 100% oxygen Intubation Type: IV induction Ventilation: Mask ventilation without difficulty Laryngoscope Size: Mac and 3 Grade View: Grade II Tube type: Oral Tube size: 7.5 mm Number of attempts: 1 Airway Equipment and Method: Stylet Placement Confirmation: ETT inserted through vocal cords under direct vision,  breath sounds checked- equal and bilateral and positive ETCO2 Secured at: 21 (cm at teeth) cm Tube secured with: Tape Dental Injury: Teeth and Oropharynx as per pre-operative assessment

## 2014-07-29 NOTE — H&P (Signed)
  Dawn SheriffLinda J Holmes MRN:  [id number] DOB/SEX:  05/05/1950/female  CHIEF COMPLAINT:  Painful left Knee  HISTORY: Patient is a 64 y.o. female presented with a history of pain in the left knee. Onset of symptoms was gradual starting several years ago with gradually worsening course since that time. Prior procedures on the knee include arthroplasty. Patient has been treated conservatively with over-the-counter NSAIDs and activity modification. Patient currently rates pain in the knee at 9 out of 10 with activity. There is pain at night.  PAST MEDICAL HISTORY: There are no active problems to display for this patient.  Past Medical History  Diagnosis Date  . Hypertension   . Heart palpitations   . Asthma   . Pneumonia     hx of  . Arthritis   . Insomnia    Past Surgical History  Procedure Laterality Date  . Knee arthroplasty Left   . Bunionectomy Bilateral   . Tonsillectomy       MEDICATIONS:   No prescriptions prior to admission    ALLERGIES:  No Known Allergies  REVIEW OF SYSTEMS:  A comprehensive review of systems was negative.   FAMILY HISTORY:  No family history on file.  SOCIAL HISTORY:   History  Substance Use Topics  . Smoking status: Never Smoker   . Smokeless tobacco: Not on file  . Alcohol Use: No     EXAMINATION:  Vital signs in last 24 hours:    General appearance: alert, cooperative and no distress Lungs: clear to auscultation bilaterally Heart: regular rate and rhythm, S1, S2 normal, no murmur, click, rub or gallop Abdomen: soft, non-tender; bowel sounds normal; no masses,  no organomegaly Extremities: extremities normal, atraumatic, no cyanosis or edema and Homans sign is negative, no sign of DVT Pulses: 2+ and symmetric Skin: Skin color, texture, turgor normal. No rashes or lesions Neurologic: Alert and oriented X 3, normal strength and tone. Normal symmetric reflexes. Normal coordination and gait  Musculoskeletal:  ROM 0-110, Ligaments intact,   Imaging Review Plain radiographs demonstrate evidence of loosening  Assessment/Plan: Left painful knee  The patient history, physical examination and imaging studies are consistent with evidence of loosening. The patient has failed conservative treatment.  The clearance notes were reviewed.  After discussion with the patient it was felt that Total Knee Revision was indicated. The procedure,  risks, and benefits of total knee arthroplasty were presented and reviewed. The risks including but not limited to aseptic loosening, infection, blood clots, vascular injury, stiffness, patella tracking problems complications among others were discussed. The patient acknowledged the explanation, agreed to proceed with the plan.  Nolan Lasser 07/29/2014, 6:40 AM

## 2014-07-29 NOTE — Progress Notes (Signed)
Orthopedic Tech Progress Note Patient Details:  Dawn HurlLinda J [facility] 05/20/1950 [id number] Applied CPM to LLE.  Began at 0-90 degrees as ordered.  Pt. could not tolerate 90 degrees flexion stating "It hurts too much."  Reduced flexion to 60 degrees for pt. comfort. Pt. stated, "That feels better."  Applied OHF with trapeze to pt.'s bed. CPM Left Knee CPM Left Knee: On Left Knee Flexion (Degrees): 60 Left Knee Extension (Degrees): 0   Lesle ChrisGilliland, Atreus Hasz L 07/29/2014, 2:26 PM

## 2014-07-29 NOTE — Transfer of Care (Signed)
Immediate Anesthesia Transfer of Care Note  Patient: Dawn HurlLinda J Holmes  Procedure(s) Performed: Procedure(s): LEFT TOTAL KNEE REVISION (Left)  Patient Location: PACU  Anesthesia Type:General  Level of Consciousness: awake and patient cooperative  Airway & Oxygen Therapy: Patient Spontanous Breathing and Patient connected to nasal cannula oxygen  Post-op Assessment: Report given to RN and Post -op Vital signs reviewed and stable  Post vital signs: Reviewed and stable  Last Vitals:  Filed Vitals:   07/29/14 1333  BP:   Pulse:   Temp: 36.8 C  Resp:     Complications: No apparent anesthesia complications

## 2014-07-29 NOTE — Evaluation (Signed)
Physical Therapy Evaluation Patient Details Name: Dawn SheriffLinda J Rhudy MRN: [id number] DOB: 04/06/1951 Today's Date: 07/29/2014   History of Present Illness  Admitted for revision of LTKA; original TKA done approx 8 years ago  Clinical Impression  Pt is s/p TKA resulting in the deficits listed below (see PT Problem List).  Pt will benefit from skilled PT to increase their independence and safety with mobility to allow discharge to the venue listed below.      Follow Up Recommendations Home health PT    Equipment Recommendations  3in1 (PT)    Recommendations for Other Services       Precautions / Restrictions Precautions Precautions: Knee;Fall Precaution Comments: Pt educated to not allow any pillow or bolster under knee for healing with optimal range of motion.  Restrictions Weight Bearing Restrictions: Yes LLE Weight Bearing: Weight bearing as tolerated      Mobility  Bed Mobility Overal bed mobility: Needs Assistance Bed Mobility: Supine to Sit     Supine to sit: Supervision     General bed mobility comments: Cues for technique; used bed rails  Transfers Overall transfer level: Needs assistance Equipment used: Rolling walker (2 wheeled) Transfers: Sit to/from Stand Sit to Stand: Min assist (steady asssit)         General transfer comment: Cues for technique; good rise and minimal L knee buckle  Ambulation/Gait Ambulation/Gait assistance: Min assist Ambulation Distance (Feet):  (pivot steps bed to chair) Assistive device: Rolling walker (2 wheeled)       General Gait Details: Cues to activate quad fo rL stance stability  Stairs            Wheelchair Mobility    Modified Rankin (Stroke Patients Only)       Balance                                             Pertinent Vitals/Pain Pain Assessment: 0-10 Pain Score: 8  Pain Location: L knee Pain Descriptors / Indicators: Aching;Grimacing Pain Intervention(s): Limited activity  within patient's tolerance;Monitored during session;Premedicated before session;Repositioned;Patient requesting pain meds-RN notified    Home Living Family/patient expects to be discharged to:: Private residence Living Arrangements: Other (Comment) (will be going to her mom's home) Available Help at Discharge: Available 24 hours/day (likley can only give Supervision) Type of Home: House Home Access: Stairs to enter Entrance Stairs-Rails: Right;Left (front, rail R; back entrance, rail L) Entrance Stairs-Number of Steps: 2 Home Layout: One level Home Equipment: Walker - 2 wheels      Prior Function Level of Independence: Independent               Hand Dominance        Extremity/Trunk Assessment   Upper Extremity Assessment: Overall WFL for tasks assessed           Lower Extremity Assessment: LLE deficits/detail   LLE Deficits / Details: Grossly decr AROM and strength, limited by pain; Noted good quad activation and extension range; flexion very limited by pain     Communication   Communication: No difficulties  Cognition Arousal/Alertness: Awake/alert Behavior During Therapy: WFL for tasks assessed/performed Overall Cognitive Status: Within Functional Limits for tasks assessed                      General Comments      Exercises Total Joint Exercises Quad Sets:  AROM;Left;10 reps Heel Slides:  (very painful with flexion) Goniometric ROM: 0-30 flexion limited by pain      Assessment/Plan    PT Assessment Patient needs continued PT services  PT Diagnosis Difficulty walking;Acute pain   PT Problem List Decreased strength;Decreased range of motion;Decreased activity tolerance;Decreased balance;Decreased mobility;Decreased knowledge of use of DME;Decreased knowledge of precautions;Pain  PT Treatment Interventions DME instruction;Gait training;Stair training;Functional mobility training;Therapeutic activities;Therapeutic exercise;Balance  training;Patient/family education   PT Goals (Current goals can be found in the Care Plan section) Acute Rehab PT Goals Patient Stated Goal: back to gardening PT Goal Formulation: With patient Time For Goal Achievement: 08/12/14 Potential to Achieve Goals: Good    Frequency 7X/week   Barriers to discharge        Co-evaluation               End of Session   Activity Tolerance: Patient tolerated treatment well Patient left: in chair;with call bell/phone within reach Nurse Communication: Mobility status         Time: 1610-9604 PT Time Calculation (min) (ACUTE ONLY): 25 min   Charges:   PT Evaluation $Initial PT Evaluation Tier I: 1 Procedure PT Treatments $Therapeutic Activity: 8-22 mins   PT G Codes:        Olen Pel 07/29/2014, 4:59 PM  Van Clines, [place]  Acute Rehabilitation Services Pager [phone] Office [phone]

## 2014-07-29 NOTE — Anesthesia Preprocedure Evaluation (Addendum)
Anesthesia Evaluation  Patient identified by MRN, date of birth, ID band Patient awake    Reviewed: Allergy & Precautions, NPO status , Patient's Chart, lab work & pertinent test results  Airway Mallampati: II  TM Distance: >3 FB Neck ROM: full    Dental  (+) Teeth Intact, Dental Advisory Given   Pulmonary asthma ,  Advair this morning.  No albuterol in several weeks breath sounds clear to auscultation        Cardiovascular hypertension, Pt. on medications Rhythm:regular Rate:Normal     Neuro/Psych    GI/Hepatic   Endo/Other    Renal/GU      Musculoskeletal  (+) Arthritis -,   Abdominal   Peds  Hematology   Anesthesia Other Findings   Reproductive/Obstetrics                            Anesthesia Physical Anesthesia Plan  ASA: II  Anesthesia Plan: Spinal   Post-op Pain Management:    Induction: Intravenous  Airway Management Planned: Simple Face Mask  Additional Equipment:   Intra-op Plan:   Post-operative Plan:   Informed Consent: I have reviewed the patients History and Physical, chart, labs and discussed the procedure including the risks, benefits and alternatives for the proposed anesthesia with the patient or authorized representative who has indicated his/her understanding and acceptance.     Plan Discussed with: CRNA, Anesthesiologist and Surgeon  Anesthesia Plan Comments:         Anesthesia Quick Evaluation

## 2014-07-29 NOTE — Plan of Care (Signed)
Problem: Consults Goal: Diagnosis- Total Joint Replacement Primary Total Knee LEft     

## 2014-07-30 ENCOUNTER — Encounter (HOSPITAL_COMMUNITY): Payer: Self-pay | Admitting: Orthopedic Surgery

## 2014-07-30 LAB — CBC
HCT: 29.2 % — ABNORMAL LOW (ref 36.0–46.0)
HEMOGLOBIN: 9.6 g/dL — AB (ref 12.0–15.0)
MCH: 29.2 pg (ref 26.0–34.0)
MCHC: 32.9 g/dL (ref 30.0–36.0)
MCV: 88.8 fL (ref 78.0–100.0)
PLATELETS: 246 10*3/uL (ref 150–400)
RBC: 3.29 MIL/uL — ABNORMAL LOW (ref 3.87–5.11)
RDW: 12.9 % (ref 11.5–15.5)
WBC: 7.4 10*3/uL (ref 4.0–10.5)

## 2014-07-30 LAB — BASIC METABOLIC PANEL
Anion gap: 5 (ref 5–15)
BUN: 7 mg/dL (ref 6–23)
CHLORIDE: 103 mmol/L (ref 96–112)
CO2: 29 mmol/L (ref 19–32)
Calcium: 8.3 mg/dL — ABNORMAL LOW (ref 8.4–10.5)
Creatinine, Ser: 0.69 mg/dL (ref 0.50–1.10)
GFR calc Af Amer: >90 mL/min (ref >90)
GFR calc non Af Amer: >90 mL/min (ref >90)
GLUCOSE: 134 mg/dL — AB (ref 70–99)
Potassium: 3.9 mmol/L (ref 3.5–5.1)
Sodium: 137 mmol/L (ref 135–145)

## 2014-07-30 MED ORDER — OXYCODONE HCL ER 20 MG PO T12A: 20.0000 mg | EXTENDED_RELEASE_TABLET | Freq: Two times a day (BID) | ORAL | Status: AC

## 2014-07-30 MED ORDER — ENOXAPARIN SODIUM 40 MG/0.4ML ~~LOC~~ SOLN: 40.0000 mg | SUBCUTANEOUS | Status: AC

## 2014-07-30 MED ORDER — METHOCARBAMOL 500 MG PO TABS: 500.0000 mg | ORAL_TABLET | Freq: Four times a day (QID) | ORAL | Status: AC | PRN

## 2014-07-30 MED ORDER — HYDROMORPHONE HCL 1 MG/ML IJ SOLN
1.0000 mg | INTRAMUSCULAR | Status: DC
  Administered 2014-07-30 – 2014-07-31 (×11): 1 mg via INTRAVENOUS
  Filled 2014-07-30 (×11): qty 1

## 2014-07-30 MED ORDER — OXYCODONE HCL ER 20 MG PO T12A
20.0000 mg | EXTENDED_RELEASE_TABLET | Freq: Two times a day (BID) | ORAL | Status: DC
  Administered 2014-07-30 – 2014-07-31 (×2): 20 mg via ORAL
  Filled 2014-07-30 (×2): qty 1

## 2014-07-30 MED ORDER — OXYCODONE HCL 5 MG PO TABS: 5.0000 mg | ORAL_TABLET | ORAL | Status: AC | PRN

## 2014-07-30 NOTE — Progress Notes (Signed)
Physical Therapy Treatment Patient Details Name: Dawn Holmes MRN: [id number] DOB: 23-Mar-1951 Today's Date: 07/30/2014    History of Present Illness Admitted for revision of LTKA; original TKA done approx 8 years ago    PT Comments    Pt. Continues to improve with gait. She is bearing more weight on LLE and less through the walker. Pt. Was able to complete stair training safely. Pain continues to be her limiting complaint. Continue with PT's POC.  Follow Up Recommendations  Home health PT     Equipment Recommendations  3in1 (PT)    Recommendations for Other Services       Precautions / Restrictions Precautions Precautions: Knee;Fall Precaution Comments: reviewed knee precautions and importance of no pillow under knee Restrictions Weight Bearing Restrictions: Yes LLE Weight Bearing: Weight bearing as tolerated    Mobility  Bed Mobility Overal bed mobility: Modified Independent Bed Mobility: Supine to Sit     Supine to sit: Min assist     General bed mobility comments: Min A for LLE   Transfers Overall transfer level: Modified independent Equipment used: Rolling walker (2 wheeled) Transfers: Sit to/from Stand Sit to Stand: Supervision         General transfer comment: Patient with safe technique  Ambulation/Gait Ambulation/Gait assistance: Supervision Ambulation Distance (Feet): 80 Feet Assistive device: Rolling walker (2 wheeled) Gait Pattern/deviations: Step-through pattern;Decreased stance time - left;Decreased stride length   Gait velocity interpretation: Below normal speed for age/gender General Gait Details: Pt. able to continue with step through pattern. Continues with slight occasional knee buckle, but continues to self-control.   Stairs Stairs: Yes Stairs assistance: Min guard Stair Management: One rail Left;Step to pattern;Forwards Number of Stairs: 5 General stair comments: Pt. educated on sequencing and techinque. Able to complete stairs  safely.  Wheelchair Mobility    Modified Rankin (Stroke Patients Only)       Balance                                    Cognition Arousal/Alertness: Awake/alert Behavior During Therapy: WFL for tasks assessed/performed Overall Cognitive Status: Within Functional Limits for tasks assessed                      Exercises Total Joint Exercises Quad Sets: AROM;Left;10 reps Heel Slides: AAROM;Left;10 reps Hip ABduction/ADduction: AAROM;Left;10 reps Straight Leg Raises: AAROM;Left;10 reps    General Comments        Pertinent Vitals/Pain Pain Assessment: 0-10 Pain Score: 7  Pain Location: L knee Pain Descriptors / Indicators: Aching Pain Intervention(s): Monitored during session    Home Living Family/patient expects to be discharged to:: Private residence Living Arrangements: Parent Available Help at Discharge: Available 24 hours/day;Family Type of Home: House   Entrance Stairs-Rails: Right;Left Home Layout: One level Home Equipment: Environmental consultant - 2 wheels;Shower seat      Prior Function Level of Independence: Independent          PT Goals (current goals can now be found in the care plan section) Acute Rehab PT Goals Patient Stated Goal: go home today Progress towards PT goals: Progressing toward goals    Frequency  7X/week    PT Plan Current plan remains appropriate    Co-evaluation             End of Session   Activity Tolerance: Patient tolerated treatment well Patient left: in bed;with call bell/phone within reach  Time: 1410-1436 PT Time Calculation (min) (ACUTE ONLY): 26 min  Charges:  $Gait Training: 8-22 mins $Therapeutic Exercise: 8-22 mins                    G Codes:      Leonard SchwartzRumley, Klare Criss, SPTA 07/30/2014, 2:49 PM

## 2014-07-30 NOTE — Op Note (Signed)
Dawn Holmes, RODENBERG                  ACCOUNT NO.:  [id number]  MEDICAL RECORD NO.:  [id number]  LOCATION:  5N07C                        FACILITY:  [facility]  PHYSICIAN:  Mila Homer. Sherlean Foot, M.D. DATE OF BIRTH:  1950/11/27  DATE OF PROCEDURE:  07/29/2014 DATE OF DISCHARGE:                              OPERATIVE REPORT   SURGEON:  Mila Homer. Sherlean Foot, M.D.  ASSISTANT:  Altamese Cabal, PA-C  ANESTHESIA:  General.  PREOPERATIVE DIAGNOSIS:  Failed left total knee replacement.  POSTOPERATIVE DIAGNOSIS:  Failed left total knee replacement.  PROCEDURE:  Left complete revision total knee arthroplasty.  INDICATION FOR PROCEDURE:  The patient is a 64 year old, white female, who is 8 years out from a primary knee replacement.  She suffered a fall directly onto the knee, debonding her components which became obvious over time and this was approximately 2 years ago.  She waited a bit for. The pain got to the point where she could no longer do her job.  An informed consent was obtained for complete revision with both the femoral and tibial components loose upon x-ray.  Informed consent was obtained.  DESCRIPTION OF PROCEDURE:  The patient was laid supine and administered general anesthesia.  The left leg was prepped and draped in usual fashion.  A #10 blade was used and made the old incision.  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 The components were grossly loose.  Debonded the components from the cement.  There was no cement onto the tibia at all.  I think that was the part that had loosened and then created a cascade of events with loosening of the femoral component as well.  I put the knee in 90 degrees of flexion.  I then used a series of Rogers and curettes to curette out the bone.  There was a tight D femur with several cysts, especially on the lateral femoral condyle and the posterior  medial femoral condyle.  I then turned my attention to the tibia.  There was much less bone loss here, but this tibial component had subsided probably 5-6 mm of posterior slope.  Once I had gotten rid of all the soft tissue and debris out of the bony defects, I then reamed the tibia to allow the femur to a 15.  I then used an E-femur and cut the notch with a 5 distal augment in place.  I then trialed with a 5 distal augment and decided to recut through the slot on the medial side to accept a pin, distal augment to give me more bone-on-bone contact in the weightbearing compartment.  At this point, I felt I had adequately recreated the femur.  I then used a 4 template on the tibia drilled and keeled for the keel and then trialed with an 11 stem and that also recreated the tibia well.  I then trialed and had to use a 23 LPS insert.  Also did a little bit of medial releasing.  The patella was not lose and tracked well.  I then removed trial components and copiously irrigated.  I then used 20 mL  of cancellous allograft chips to fill the defect in the femur.  I then mixed [employer] cement and cemented in the tibial component, first femoral component,then polyethylene.   We left the tourniquet down at 2 hours,obtained hemostasis.  I did put in my Exparel mixture for postoperative pain relief.  I then closed the arthrotomy with #1 Vicryl sutures, deep soft tissue was buried with 0 Vicryl sutures, subcuticular 2-0 running Vicryl suture and skin staples.  The patient tolerated the procedure well.  EBL 300 mL. Tourniquet time 2 hours and 5 minutes.          ______________________________ Mila HomerStephen D. Sherlean FootLucey, M.D.     SDL/MEDQ  D:  07/30/2014  T:  07/30/2014  Job:  [id number]

## 2014-07-30 NOTE — Progress Notes (Signed)
SPORTS MEDICINE AND JOINT REPLACEMENT  Georgena SpurlingStephen Lucey, MD   Altamese CabalMaurice Atsushi Yom, PA-C [address], [place], [place]  [id number]                             [phone]   PROGRESS NOTE  Subjective:  negative for Chest Pain  negative for Shortness of Breath  negative for Nausea/Vomiting   negative for Calf Pain  negative for Bowel Movement   Tolerating Diet: yes         Patient reports pain as 6 on 0-10 scale.    Objective: Vital signs in last 24 hours:   Patient Vitals for the past 24 hrs:  BP Temp Temp src Pulse Resp SpO2  07/30/14 1138 (!) 115/55 mmHg 99 F (37.2 C) Oral (!) 106 - -  07/30/14 1005 105/76 mmHg - - - - -  07/30/14 0745 118/85 mmHg 98.6 F (37 C) Oral (!) 105 18 95 %  07/30/14 0400 - - - - 20 95 %  07/30/14 0000 - - - - 20 95 %  07/29/14 2010 136/67 mmHg 98.9 F (37.2 C) Axillary (!) 121 - 95 %  07/29/14 2000 - - - - 20 95 %    @flow {1959:LAST@   Intake/Output from previous day:   04/04 0701 - 04/05 0700 In: 1980 [P.O.:280; I.V.:1650] Out: 1060 [Urine:650; Drains:310]   Intake/Output this shift:   04/05 0701 - 04/05 1900 In: 560 [P.O.:560] Out: -    Intake/Output      04/04 0701 - 04/05 0700 04/05 0701 - 04/06 0700   P.O. 280 560   I.V. (mL/kg) 1650 (22.7)    IV Piggyback 50    Total Intake(mL/kg) 1980 (27.3) 560 (7.7)   Urine (mL/kg/hr) 650    Drains 310    Blood 100    Total Output 1060     Net +920 +560        Urine Occurrence 5 x 1 x      LABORATORY DATA:  Recent Labs  07/29/14 1602 07/30/14 0705  WBC 11.4* 7.4  HGB 10.8* 9.6*  HCT 32.5* 29.2*  PLT 290 246    Recent Labs  07/29/14 1602 07/30/14 0705  NA  --  137  K  --  3.9  CL  --  103  CO2  --  29  BUN  --  7  CREATININE 0.62 0.69  GLUCOSE  --  134*  CALCIUM  --  8.3*   Lab Results  Component Value Date   INR 1.01 07/18/2014   INR 1.0 11/22/2006    Examination:  General appearance: alert, cooperative and no distress Extremities: Homans sign is  negative, no sign of DVT  Wound Exam: clean, dry, intact   Drainage:  None: wound tissue dry  Motor Exam: EHL and FHL Intact  Sensory Exam: Deep Peroneal normal   Assessment:    1 Day Post-Op  Procedure(s) (LRB): LEFT TOTAL KNEE REVISION (Left)  ADDITIONAL DIAGNOSIS:  Active Problems:   S/P revision of total knee  Acute Blood Loss Anemia   Plan: Physical Therapy as ordered Weight Bearing as Tolerated (WBAT)  DVT Prophylaxis:  Lovenox  DISCHARGE PLAN: Home  DISCHARGE NEEDS: HHPT, CPM, Walker and 3-in-1 comode seat         Dawn Holmes 07/30/2014, 5:16 PM

## 2014-07-30 NOTE — Progress Notes (Signed)
Orthopedic Tech Progress Note Patient Details:  [facility]inda [facility] 02/26/1951 [id number] Patient placed in CPM CPM Left Knee CPM Left Knee: On Left Knee Flexion (Degrees): 60 Left Knee Extension (Degrees): 0 Additional Comments: completed 1.5 hours   Dawn Holmes 07/30/2014, 2:44 PM

## 2014-07-30 NOTE — Discharge Instructions (Signed)

## 2014-07-30 NOTE — Progress Notes (Signed)
Physical Therapy Treatment Patient Details Name: Kathyrn SheriffLinda J Teodoro MRN: [id number] DOB: 04/01/1951 Today's Date: 07/30/2014    History of Present Illness Admitted for revision of LTKA; original TKA done approx 8 years ago    PT Comments    Patient with increased pain this session. Also stating she did not sleep well. Able to ambulate this AM however deferred steps until afternoon session. Will attempt later today as patient is planning to DC later this afternoon.   Follow Up Recommendations  Home health PT     Equipment Recommendations  3in1 (PT)    Recommendations for Other Services       Precautions / Restrictions Precautions Precautions: Knee;Fall Restrictions Weight Bearing Restrictions: Yes LLE Weight Bearing: Weight bearing as tolerated    Mobility  Bed Mobility Overal bed mobility: Needs Assistance Bed Mobility: Supine to Sit     Supine to sit: Min assist     General bed mobility comments: Min A for LLE   Transfers Overall transfer level: Needs assistance Equipment used: Rolling walker (2 wheeled) Transfers: Sit to/from Stand Sit to Stand: Supervision         General transfer comment: Patient with safe technique  Ambulation/Gait Ambulation/Gait assistance: Min guard Ambulation Distance (Feet): 90 Feet Assistive device: Rolling walker (2 wheeled) Gait Pattern/deviations: Step-through pattern;Decreased stride length     General Gait Details: Cues for gait sequence and use of RW. Patient able to start with step through pattern. One small evidence of buckling but patient able to feel and self correct.    Stairs            Wheelchair Mobility    Modified Rankin (Stroke Patients Only)       Balance                                    Cognition Arousal/Alertness: Awake/alert Behavior During Therapy: WFL for tasks assessed/performed Overall Cognitive Status: Within Functional Limits for tasks assessed                       Exercises Total Joint Exercises Quad Sets: AROM;Left;10 reps Heel Slides: AAROM;Left;10 reps Hip ABduction/ADduction: AAROM;Left;10 reps Straight Leg Raises: AAROM;Left;10 reps    General Comments        Pertinent Vitals/Pain Pain Score: 8  Pain Location: L knee Pain Descriptors / Indicators: Aching Pain Intervention(s): Monitored during session    Home Living                      Prior Function            PT Goals (current goals can now be found in the care plan section) Progress towards PT goals: Progressing toward goals    Frequency  7X/week    PT Plan Current plan remains appropriate    Co-evaluation             End of Session   Activity Tolerance: Patient tolerated treatment well Patient left: in chair;with call bell/phone within reach     Time: 1024-1047 PT Time Calculation (min) (ACUTE ONLY): 23 min  Charges:  $Gait Training: 8-22 mins $Therapeutic Exercise: 8-22 mins                    G Codes:      Fredrich BirksRobinette, Julia Elizabeth 07/30/2014, 10:54 AM  07/30/2014 Fredrich Birksobinette, Julia Elizabeth PTA [phone] pager [phone] office

## 2014-07-30 NOTE — Care Management Utilization Note (Signed)
Utilization review completed.  

## 2014-07-30 NOTE — Op Note (Signed)
Dictation Number: [id number]

## 2014-07-30 NOTE — Evaluation (Signed)
Occupational Therapy Evaluation Patient Details Name: Dawn Holmes MRN: [id number] DOB: 02/20/1951 Today's Date: 07/30/2014    History of Present Illness Admitted for revision of LTKA; original TKA done approx 8 years ago   Clinical Impression   Patient admitted with above. Patient independent PTA. Patient currently functioning at an overall supervision>mod I level. Patient states her mother will be available to provide 24/7 supervision post acute d/c. D/C from acute OT services and no additional follow-up OT needs at this time. All appropriate education provided to patient. Please re-order OT as needed.      Follow Up Recommendations  No OT follow up;Supervision/Assistance - 24 hour    Equipment Recommendations  3 in 1 bedside comode    Recommendations for Other Services  None at this time   Precautions / Restrictions Precautions Precautions: Knee;Fall Precaution Comments: reviewed knee precautions and importance of no pillow under knee Restrictions Weight Bearing Restrictions: Yes LLE Weight Bearing: Weight bearing as tolerated      Mobility - Per PT note Bed Mobility Overal bed mobility: Needs Assistance Bed Mobility: Supine to Sit     Supine to sit: Min assist     General bed mobility comments: Min A for LLE   Transfers Overall transfer level: Needs assistance Equipment used: Rolling walker (2 wheeled) Transfers: Sit to/from Stand Sit to Stand: Supervision         General transfer comment: Patient with safe technique         ADL Overall ADL's : Modified independent     General ADL Comments: Per patient report she completed bathing and dressing herself this am. Patient demonstrated ability to reach BLEs. Educated patient on compensatory strategies for LB ADLs and patient verbalized understanding of this. Patient refused any transfers at this time due to pain. Per PT note in chart, patient safe to discharge > home today. No additional or further OT needs  identified at this time.     Pertinent Vitals/Pain Pain Assessment: 0-10 Pain Score: 6  Pain Location: Left knee Pain Descriptors / Indicators: Aching Pain Intervention(s): Monitored during session     Hand Dominance Right   Extremity/Trunk Assessment Upper Extremity Assessment Upper Extremity Assessment: Overall WFL for tasks assessed   Lower Extremity Assessment Lower Extremity Assessment: Defer to PT evaluation   Cervical / Trunk Assessment Cervical / Trunk Assessment: Normal   Communication Communication Communication: No difficulties   Cognition Arousal/Alertness: Awake/alert Behavior During Therapy: WFL for tasks assessed/performed Overall Cognitive Status: Within Functional Limits for tasks assessed               Home Living Family/patient expects to be discharged to:: Private residence Living Arrangements: Parent Available Help at Discharge: Available 24 hours/day;Family Type of Home: House   Entrance Stairs-Number of Steps: 2 Entrance Stairs-Rails: Right;Left Home Layout: One level     Bathroom Shower/Tub: Tub/shower unit;Curtain   FirefighterBathroom Toilet: Standard     Home Equipment: Environmental consultantWalker - 2 wheels;Shower seat          Prior Functioning/Environment Level of Independence: Independent       OT Diagnosis:  n/a, no acute OT needs identified at this time   OT Problem List:   n/a, no acute OT needs identified at this time   OT Treatment/Interventions:   n/a, no acute OT needs identified at this time   OT Goals(Current goals can be found in the care plan section) Acute Rehab OT Goals Patient Stated Goal: go home today  OT Frequency:  n/a, no acute OT needs identified at this time   Barriers to D/C:  None known at this time   End of Session CPM Left Knee CPM Left Knee: Off  Activity Tolerance: Patient limited by pain Patient left: in chair;with call bell/phone within reach   Time: 1610-9604 OT Time Calculation (min): 9 min Charges:  OT  General Charges $OT Visit: 1 Procedure OT Evaluation $Initial OT Evaluation Tier I: 1 Procedure  Aylla Huffine , MS, OTR/L, CLT Pager: [phone]  07/30/2014, 11:18 AM

## 2014-07-30 NOTE — Care Management Note (Signed)
CARE MANAGEMENT NOTE 07/30/2014  Patient:  Dawn Holmes,Dawn Holmes   Account Number:  [id number]  Date Initiated:  07/30/2014  Documentation initiated by:  Vance PeperBRADY,Reylynn Vanalstine  Subjective/Objective Assessment:   64 yr old female admitted with loosening of the left knee. Patient underwent a left total knee arthroplasty..     Action/Plan:   Case manager spoke with patient concerning home health and DME needs. Patient preoperatively setup with [facility], no changes. Has support at discharge. Has her own RW.   Anticipated DC Date:  07/30/2014   Anticipated DC Plan:  HOME W HOME HEALTH SERVICES      DC Planning Services  CM consult      PAC Choice  DURABLE MEDICAL EQUIPMENT  HOME HEALTH   Choice offered to / List presented to:  C-1 Patient   DME arranged  CPM  3-N-1      DME agency  [employer]  [facility]     HH arranged  HH[facility]2 PT      [facility]   Status of service:  Completed, signed off Medicare Important Message given?   (If response is "NO", the following Medicare IM given date fields will be blank) Date Medicare IM given:   Medicare IM given by:   Date Additional Medicare IM given:   Additional Medicare IM given by:    Discharge Disposition:  HOME W HOME HEALTH SERVICES  Per UR Regulation:  Reviewed for med. necessity/level of care/duration of stay

## 2014-07-31 LAB — CBC
HCT: 26.3 % — ABNORMAL LOW (ref 36.0–46.0)
Hemoglobin: 8.6 g/dL — ABNORMAL LOW (ref 12.0–15.0)
MCH: 29.2 pg (ref 26.0–34.0)
MCHC: 32.7 g/dL (ref 30.0–36.0)
MCV: 89.2 fL (ref 78.0–100.0)
PLATELETS: 212 10*3/uL (ref 150–400)
RBC: 2.95 MIL/uL — ABNORMAL LOW (ref 3.87–5.11)
RDW: 13 % (ref 11.5–15.5)
WBC: 7.9 10*3/uL (ref 4.0–10.5)

## 2014-07-31 NOTE — Anesthesia Postprocedure Evaluation (Signed)
Anesthesia Post Note  Patient: Dawn Holmes  Procedure(s) Performed: Procedure(s) (LRB): LEFT TOTAL KNEE REVISION (Left)  Anesthesia type: General  Patient location: PACU  Post pain: Pain level controlled and Adequate analgesia  Post assessment: Post-op Vital signs reviewed, Patient's Cardiovascular Status Stable, Respiratory Function Stable, Patent Airway and Pain level controlled  Last Vitals:  Filed Vitals:   07/31/14 1200  BP:   Pulse:   Temp:   Resp: 18    Post vital signs: Reviewed and stable  Level of consciousness: awake, alert  and oriented  Complications: No apparent anesthesia complications

## 2014-07-31 NOTE — Progress Notes (Signed)
Physical Therapy Treatment Patient Details Name: Dawn SheriffLinda J Durden MRN: [id number] DOB: 10/14/1950 Today's Date: 07/31/2014    History of Present Illness Admitted for revision of LTKA; original TKA done approx 8 years ago    PT Comments    Pt continuing to progress toward goals, but is still limited due to pain. Pt safe to D/C from a mobility standpoint based on progression toward goals set on PT eval.  Follow Up Recommendations  Home health PT     Equipment Recommendations  3in1 (PT)    Recommendations for Other Services       Precautions / Restrictions Precautions Precautions: Knee;Fall Precaution Comments: reviewed knee precautions and importance of no pillow under knee Restrictions Weight Bearing Restrictions: Yes LLE Weight Bearing: Weight bearing as tolerated    Mobility  Bed Mobility Overal bed mobility: Modified Independent Bed Mobility: Supine to Sit     Supine to sit: Min assist     General bed mobility comments: Min A for LLE  Transfers Overall transfer level: Modified independent Equipment used: Rolling walker (2 wheeled)   Sit to Stand: Supervision         General transfer comment: Pt safe with technique  Ambulation/Gait Ambulation/Gait assistance: Supervision Ambulation Distance (Feet): 160 Feet Assistive device: Rolling walker (2 wheeled) Gait Pattern/deviations: Step-through pattern;Decreased stance time - left;Decreased stride length   Gait velocity interpretation: Below normal speed for age/gender General Gait Details: Pt continues with step through pattern. Required cues for posture/head forward.   Stairs   Stairs assistance: Min guard Stair Management: Two rails;Step to pattern;Forwards Number of Stairs: 5 General stair comments: Pt able to complete stairs safely.  Wheelchair Mobility    Modified Rankin (Stroke Patients Only)       Balance                                    Cognition Arousal/Alertness:  Awake/alert Behavior During Therapy: WFL for tasks assessed/performed Overall Cognitive Status: Within Functional Limits for tasks assessed                      Exercises      General Comments        Pertinent Vitals/Pain Pain Assessment: 0-10 Pain Score: 7  Pain Location: L knee Pain Descriptors / Indicators: Sore Pain Intervention(s): Monitored during session    Home Living                      Prior Function            PT Goals (current goals can now be found in the care plan section) Progress towards PT goals: Progressing toward goals    Frequency  7X/week    PT Plan Current plan remains appropriate    Co-evaluation             End of Session   Activity Tolerance: Patient tolerated treatment well Patient left: in bed;in CPM;with call bell/phone within reach     Time: 1023-1040 PT Time Calculation (min) (ACUTE ONLY): 17 min  Charges:                       G CodesLeonard Schwartz:      Sharlyn Odonnel, SPTA 07/31/2014, 10:52 AM

## 2014-07-31 NOTE — Progress Notes (Addendum)
Called to the room  At 1545 by case manager saying Pt was in the hall in a wheelchair complaining that her ride was outside to take her home and she had not been given paper work. She stated that she told the nurse that her ride was coming at 3pm. I was never told what time she wanted to be discharged. She had told me at 1430 when I admin her dilaudid she would let me know when her ride could come to pick her up. I printed her D/C papers and gave her her scripts at this time. Pt refused to read her instructions saying her ride was waiting outside for her. Since she refused to read over her D/C instructions I gave her my work cell phone number as well as my private cell phone number. I advised her to read over the paper work in the car and to call me when she got home and we would review it together or I could answer any questions she may have. She agreed with that and stated she would call me as soon as she got home. Pt was assisted to a wheelchair and taken outside to meet her ride.

## 2014-08-08 NOTE — Discharge Summary (Signed)
SPORTS MEDICINE & JOINT REPLACEMENT   Dawn Spurling, MD   Dawn Cabal, PA-C [address] [place], [place], [place]  [address]                             [phone]  PATIENT ID: Dawn Holmes        MRN:  [id number]          DOB/AGE: March 07, 1951 / 64 y.o.    DISCHARGE SUMMARY  ADMISSION DATE:    07/29/2014 DISCHARGE DATE:   07/31/2014  ADMISSION DIAGNOSIS: loosening of left total knee    DISCHARGE DIAGNOSIS:  loosening of left total knee    ADDITIONAL DIAGNOSIS: Active Problems:   S/P revision of total knee  Past Medical History  Diagnosis Date  . Hypertension   . Heart palpitations   . Asthma   . Pneumonia     hx of  . Arthritis   . Insomnia     PROCEDURE: Procedure(s): LEFT TOTAL KNEE REVISION on 07/29/2014  CONSULTS:     HISTORY:  See H&P in chart  HOSPITAL COURSE:  Dawn Holmes is a 64 y.o. admitted on 07/29/2014 and found to have a diagnosis of loosening of left total knee.  After appropriate laboratory studies were obtained  they were taken to the operating room on 07/29/2014 and underwent Procedure(s): LEFT TOTAL KNEE REVISION.   They were given perioperative antibiotics:  Anti-infectives    Start     Dose/Rate Route Frequency Ordered Stop   07/29/14 1630  ceFAZolin (ANCEF) IVPB 1 g/50 mL premix     1 g 100 mL/hr over 30 Minutes Intravenous Every 6 hours 07/29/14 1527 07/29/14 2307   07/29/14 0600  ceFAZolin (ANCEF) IVPB 2 g/50 mL premix     2 g 100 mL/hr over 30 Minutes Intravenous On call to O.R. 07/28/14 1512 07/29/14 1025    .  Tolerated the procedure well.  Placed with a foley intraoperatively.  Given Ofirmev at induction and for 48 hours.    POD# 1: Vital signs were stable.  Patient denied Chest pain, shortness of breath, or calf pain.  Patient was started on Lovenox 30 mg subcutaneously twice daily at 8am.  Consults to PT, OT, and care management were made.  The patient was weight bearing as tolerated.  CPM was placed on the operative leg 0-90 degrees  for 6-8 hours a day.  Incentive spirometry was taught.  Dressing was changed.  Hemovac was discontinued.      POD #2, Continued  PT for ambulation and exercise program.  IV saline locked.  O2 discontinued.    The remainder of the hospital course was dedicated to ambulation and strengthening.   The patient was discharged on 1 day post op in  Good condition.  Blood products given:none  DIAGNOSTIC STUDIES: Recent vital signs: No data found.      Recent laboratory studies: No results for input(s): WBC, HGB, HCT, PLT in the last 168 hours. No results for input(s): NA, K, CL, CO2, BUN, CREATININE, GLUCOSE, CALCIUM in the last 168 hours. Lab Results  Component Value Date   INR 1.01 07/18/2014   INR 1.0 11/22/2006     Recent Radiographic Studies :  Dg Chest 2 View  07/18/2014   CLINICAL DATA:  Preop knee replacement.  Hypertension  EXAM: CHEST  2 VIEW  COMPARISON:  11/28/2006  FINDINGS: The heart size and mediastinal contours are within normal limits. Mild diffuse  coarsened interstitial markings noted. Both lungs are clear. Mild degenerative disc disease noted within the mid thoracic spine.  IMPRESSION: No active cardiopulmonary disease.   Electronically Signed   By: Signa Kell M.D.   On: 07/18/2014 18:48    DISCHARGE INSTRUCTIONS: Discharge Instructions    CPM    Complete by:  As directed   Continuous passive motion machine (CPM):      Use the CPM from 0 to 90 for 6-8 hours per day.      You may increase by 10 per day.  You may break it up into 2 or 3 sessions per day.      Use CPM for 2 weeks or until you are told to stop.     Call MD / Call 911    Complete by:  As directed   If you experience chest pain or shortness of breath, CALL 911 and be transported to the hospital emergency room.  If you develope a fever above 101 F, pus (white drainage) or increased drainage or redness at the wound, or calf pain, call your surgeon's office.     Change dressing    Complete by:  As  directed   Change dressing on Thursday, then change the dressing daily with sterile 4 x 4 inch gauze dressing and apply TED hose.     Constipation Prevention    Complete by:  As directed   Drink plenty of fluids.  Prune juice may be helpful.  You may use a stool softener, such as Colace (over the counter) 100 mg twice a day.  Use MiraLax (over the counter) for constipation as needed.     Diet - low sodium heart healthy    Complete by:  As directed      Do not put a pillow under the knee. Place it under the heel.    Complete by:  As directed      Driving restrictions    Complete by:  As directed   No driving for 6 weeks     Increase activity slowly as tolerated    Complete by:  As directed      Lifting restrictions    Complete by:  As directed   No lifting for 6 weeks     TED hose    Complete by:  As directed   Use stockings (TED hose) for 2 weeks on both leg(s).  You may remove them at night for sleeping.           DISCHARGE MEDICATIONS:     Medication List    STOP taking these medications        HYDROcodone-acetaminophen 5-325 MG per tablet  Commonly known as:  NORCO/VICODIN     traMADol 50 MG tablet  Commonly known as:  ULTRAM      TAKE these medications        b complex vitamins tablet  Take 1 tablet by mouth daily.     BIOFREEZE EX  Apply 1 application topically 2 (two) times daily as needed (Knee pain).     budesonide-formoterol 160-4.5 MCG/ACT inhaler  Commonly known as:  SYMBICORT  Inhale 2 puffs into the lungs daily.     enoxaparin 40 MG/0.4ML injection  Commonly known as:  LOVENOX  Inject 0.4 mLs (40 mg total) into the skin daily.     fluticasone 50 MCG/ACT nasal spray  Commonly known as:  FLONASE  Place 1 spray into both nostrils at bedtime.     GINGER  ROOT PO  Take 5 mLs by mouth daily as needed (Drinks with Tea).     ibuprofen 200 MG tablet  Commonly known as:  ADVIL,MOTRIN  Take 400-600 mg by mouth every 8 (eight) hours as needed for mild  pain or moderate pain.     losartan 50 MG tablet  Commonly known as:  COZAAR  Take 50 mg by mouth 2 (two) times daily.     Magnesium 400 MG Caps  Take 2 capsules by mouth daily.     methocarbamol 500 MG tablet  Commonly known as:  ROBAXIN  Take 1-2 tablets (500-1,000 mg total) by mouth every 6 (six) hours as needed for muscle spasms.     multivitamin with minerals tablet  Take 1 tablet by mouth daily.     OVER THE COUNTER MEDICATION  Take 1 capsule by mouth 2 (two) times daily. Holy Basil     oxyCODONE 5 MG immediate release tablet  Commonly known as:  Oxy IR/ROXICODONE  Take 1-2 tablets (5-10 mg total) by mouth every 3 (three) hours as needed for breakthrough pain.     OxyCODONE 20 mg T12a 12 hr tablet  Commonly known as:  OXYCONTIN  Take 1 tablet (20 mg total) by mouth every 12 (twelve) hours.     Turmeric Powd  Take 5 mLs by mouth daily as needed (Drinks with tea).     zolpidem 12.5 MG CR tablet  Commonly known as:  AMBIEN CR  Take 12.5 mg by mouth at bedtime.        FOLLOW UP VISIT:       Follow-up Information    Follow up with Raymon MuttonLUCEY,STEPHEN D, MD. Call on 08/13/2014.   Specialty:  Orthopedic Surgery   Contact information:   [address] [place] [address] [phone]       DISPOSITION: HOME   CONDITION:  Good   Derrion Tritz 08/08/2014, 2:43 PM

## 2016-03-28 IMAGING — CR DG CHEST 2V
2 series · 2 of 2 positions shown · non-contrast
Comparison: 11/28/2006

CLINICAL DATA: Preop knee replacement.  Hypertension

EXAM:
CHEST  2 VIEW

[w chest pa]
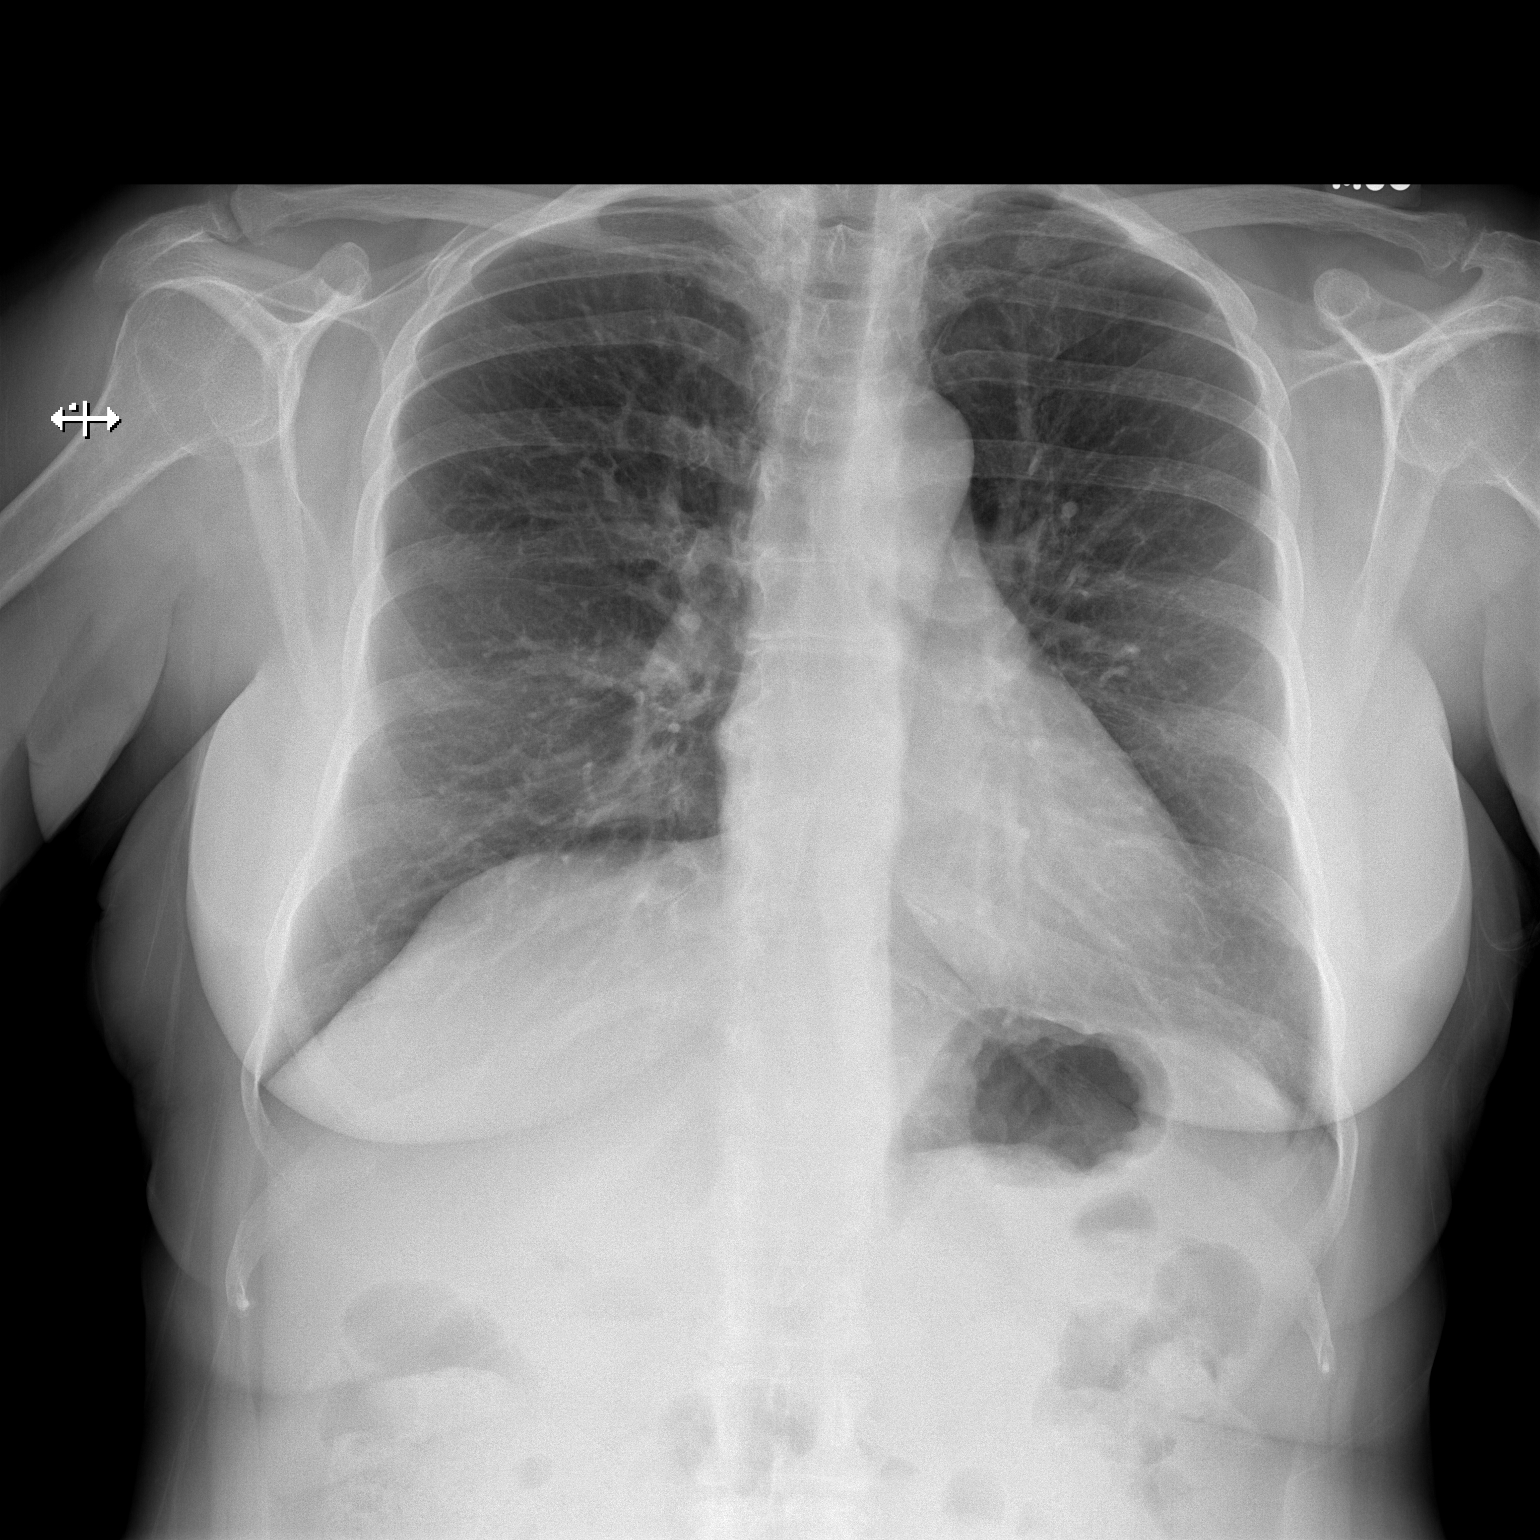

[w chest lat]
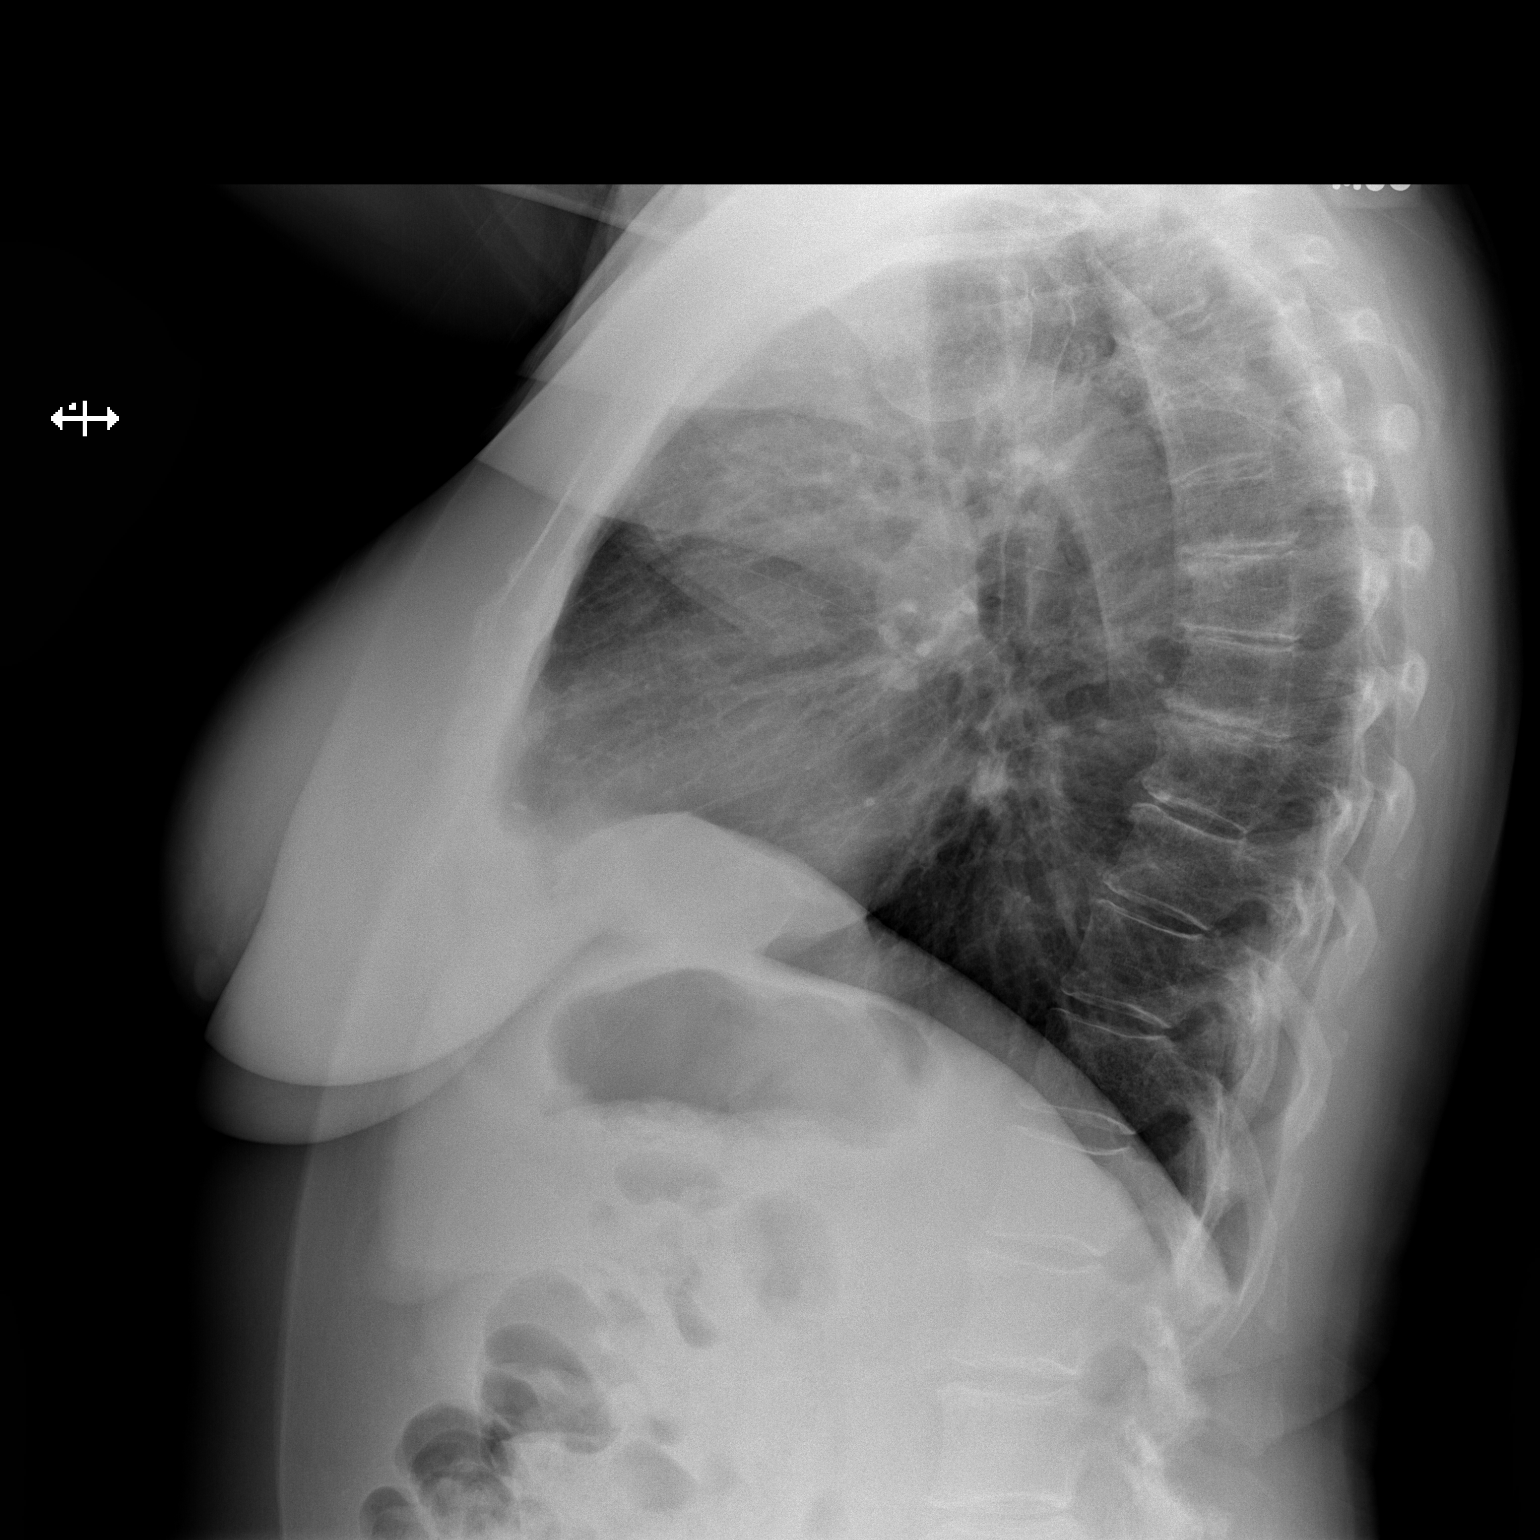

[2 of 2 positions shown; findings below may reference images not displayed]

FINDINGS: The heart size and mediastinal contours are within normal limits.
Mild diffuse coarsened interstitial markings noted. Both lungs are
clear. Mild degenerative disc disease noted within the mid thoracic
spine.
IMPRESSION: No active cardiopulmonary disease.
